# Patient Record
Sex: Female | Born: 1955 | Race: White | Hispanic: No | Marital: Married | Smoking: Never smoker
Health system: Southern US, Community
[De-identification: ages and names within clinical notes are randomized; demographics above are authoritative.]

## PROBLEM LIST (undated history)

## (undated) DIAGNOSIS — E739 Lactose intolerance, unspecified: Secondary | ICD-10-CM

## (undated) DIAGNOSIS — F1910 Other psychoactive substance abuse, uncomplicated: Secondary | ICD-10-CM

## (undated) DIAGNOSIS — D219 Benign neoplasm of connective and other soft tissue, unspecified: Secondary | ICD-10-CM

## (undated) HISTORY — PX: ADENOIDECTOMY: SUR15

## (undated) HISTORY — DX: Lactose intolerance, unspecified: E73.9

## (undated) HISTORY — DX: Benign neoplasm of connective and other soft tissue, unspecified: D21.9

## (undated) HISTORY — DX: Other psychoactive substance abuse, uncomplicated: F19.10

## (undated) HISTORY — PX: TONSILLECTOMY: SUR1361

---

## 1998-07-11 ENCOUNTER — Encounter: Admission: RE | Admit: 1998-07-11 | Discharge: 1998-07-11 | Payer: Self-pay | Admitting: Internal Medicine

## 1999-04-07 ENCOUNTER — Other Ambulatory Visit: Admission: RE | Admit: 1999-04-07 | Discharge: 1999-04-07 | Payer: Self-pay | Admitting: Obstetrics and Gynecology

## 2000-02-23 ENCOUNTER — Other Ambulatory Visit: Admission: RE | Admit: 2000-02-23 | Discharge: 2000-02-23 | Payer: Self-pay | Admitting: Obstetrics and Gynecology

## 2000-03-31 ENCOUNTER — Other Ambulatory Visit: Admission: RE | Admit: 2000-03-31 | Discharge: 2000-03-31 | Payer: Self-pay | Admitting: Radiology

## 2001-02-21 ENCOUNTER — Other Ambulatory Visit: Admission: RE | Admit: 2001-02-21 | Discharge: 2001-02-21 | Payer: Self-pay | Admitting: Obstetrics and Gynecology

## 2002-04-10 ENCOUNTER — Other Ambulatory Visit: Admission: RE | Admit: 2002-04-10 | Discharge: 2002-04-10 | Payer: Self-pay | Admitting: Obstetrics and Gynecology

## 2003-04-12 ENCOUNTER — Other Ambulatory Visit: Admission: RE | Admit: 2003-04-12 | Discharge: 2003-04-12 | Payer: Self-pay | Admitting: Obstetrics and Gynecology

## 2004-04-14 ENCOUNTER — Other Ambulatory Visit: Admission: RE | Admit: 2004-04-14 | Discharge: 2004-04-14 | Payer: Self-pay | Admitting: Obstetrics and Gynecology

## 2005-04-16 ENCOUNTER — Other Ambulatory Visit: Admission: RE | Admit: 2005-04-16 | Discharge: 2005-04-16 | Payer: Self-pay | Admitting: Obstetrics and Gynecology

## 2005-06-21 DIAGNOSIS — D219 Benign neoplasm of connective and other soft tissue, unspecified: Secondary | ICD-10-CM

## 2005-06-21 HISTORY — DX: Benign neoplasm of connective and other soft tissue, unspecified: D21.9

## 2006-04-19 ENCOUNTER — Other Ambulatory Visit: Admission: RE | Admit: 2006-04-19 | Discharge: 2006-04-19 | Payer: Self-pay | Admitting: Obstetrics and Gynecology

## 2006-05-25 HISTORY — PX: ENDOMETRIAL BIOPSY: SHX622

## 2008-04-02 ENCOUNTER — Other Ambulatory Visit: Admission: RE | Admit: 2008-04-02 | Discharge: 2008-04-02 | Payer: Self-pay | Admitting: Obstetrics and Gynecology

## 2008-06-21 DIAGNOSIS — F1910 Other psychoactive substance abuse, uncomplicated: Secondary | ICD-10-CM

## 2008-06-21 HISTORY — PX: NASAL SINUS SURGERY: SHX719

## 2008-06-21 HISTORY — DX: Other psychoactive substance abuse, uncomplicated: F19.10

## 2008-09-25 ENCOUNTER — Encounter: Admission: RE | Admit: 2008-09-25 | Discharge: 2008-09-25 | Payer: Self-pay | Admitting: Family Medicine

## 2008-10-21 ENCOUNTER — Encounter: Admission: RE | Admit: 2008-10-21 | Discharge: 2008-10-21 | Payer: Self-pay | Admitting: Family Medicine

## 2009-05-19 ENCOUNTER — Encounter: Admission: RE | Admit: 2009-05-19 | Discharge: 2009-05-19 | Payer: Self-pay | Admitting: Obstetrics and Gynecology

## 2009-06-10 ENCOUNTER — Ambulatory Visit (HOSPITAL_BASED_OUTPATIENT_CLINIC_OR_DEPARTMENT_OTHER): Admission: RE | Admit: 2009-06-10 | Discharge: 2009-06-10 | Payer: Self-pay | Admitting: Orthopedic Surgery

## 2010-04-29 IMAGING — MG MM DIGITAL SCREENING
4 series · 4 of 4 positions shown · non-contrast
Comparison: Prior studies.

DG SCREEN MAMMOGRAM BILATERAL
Bilateral CC and MLO view(s) were taken.

DIGITAL SCREENING MAMMOGRAM WITH CAD:

[R CC]
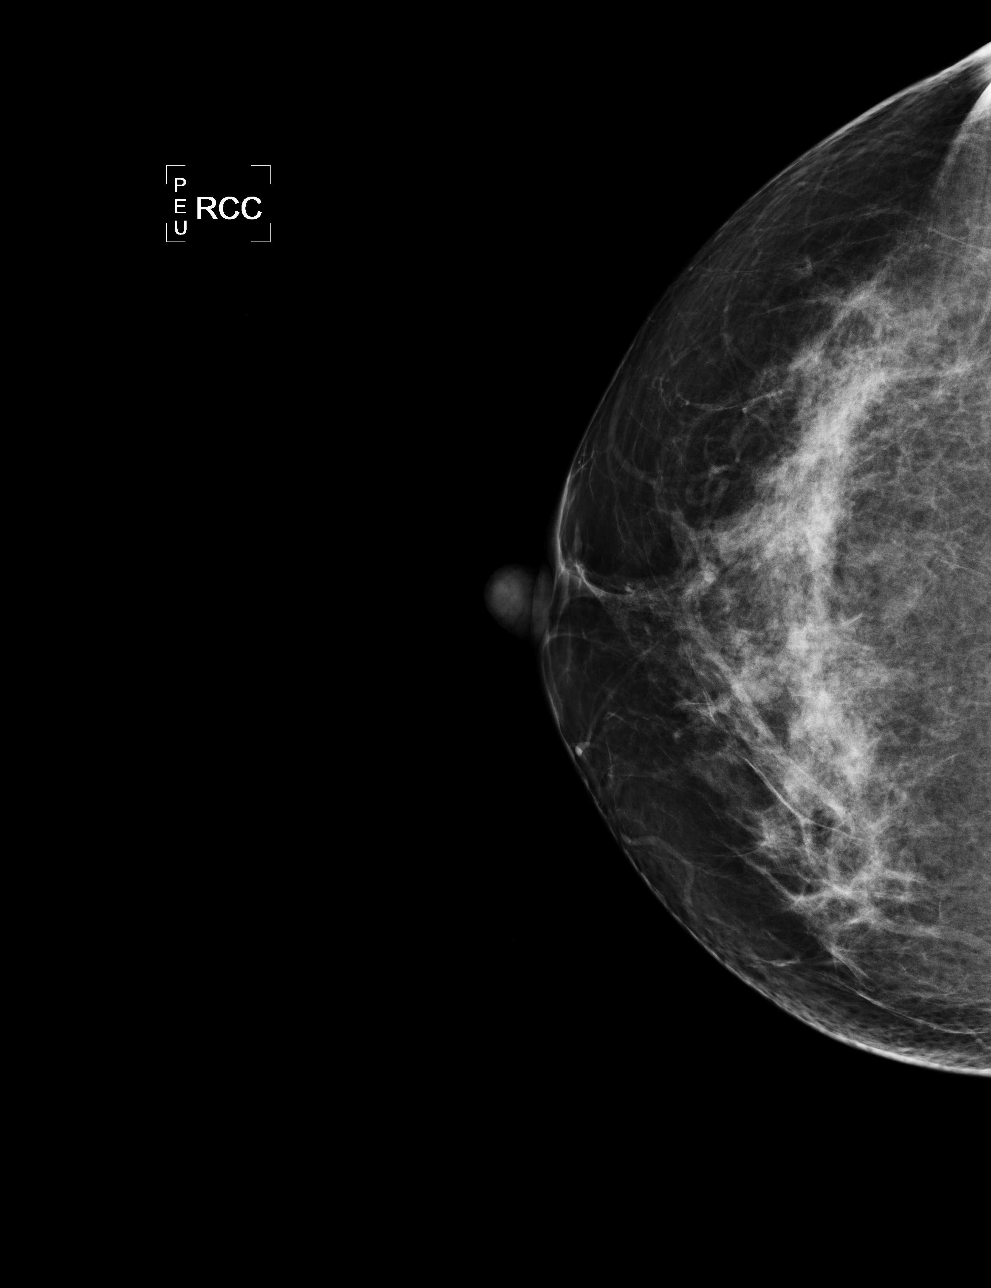

[L CC]
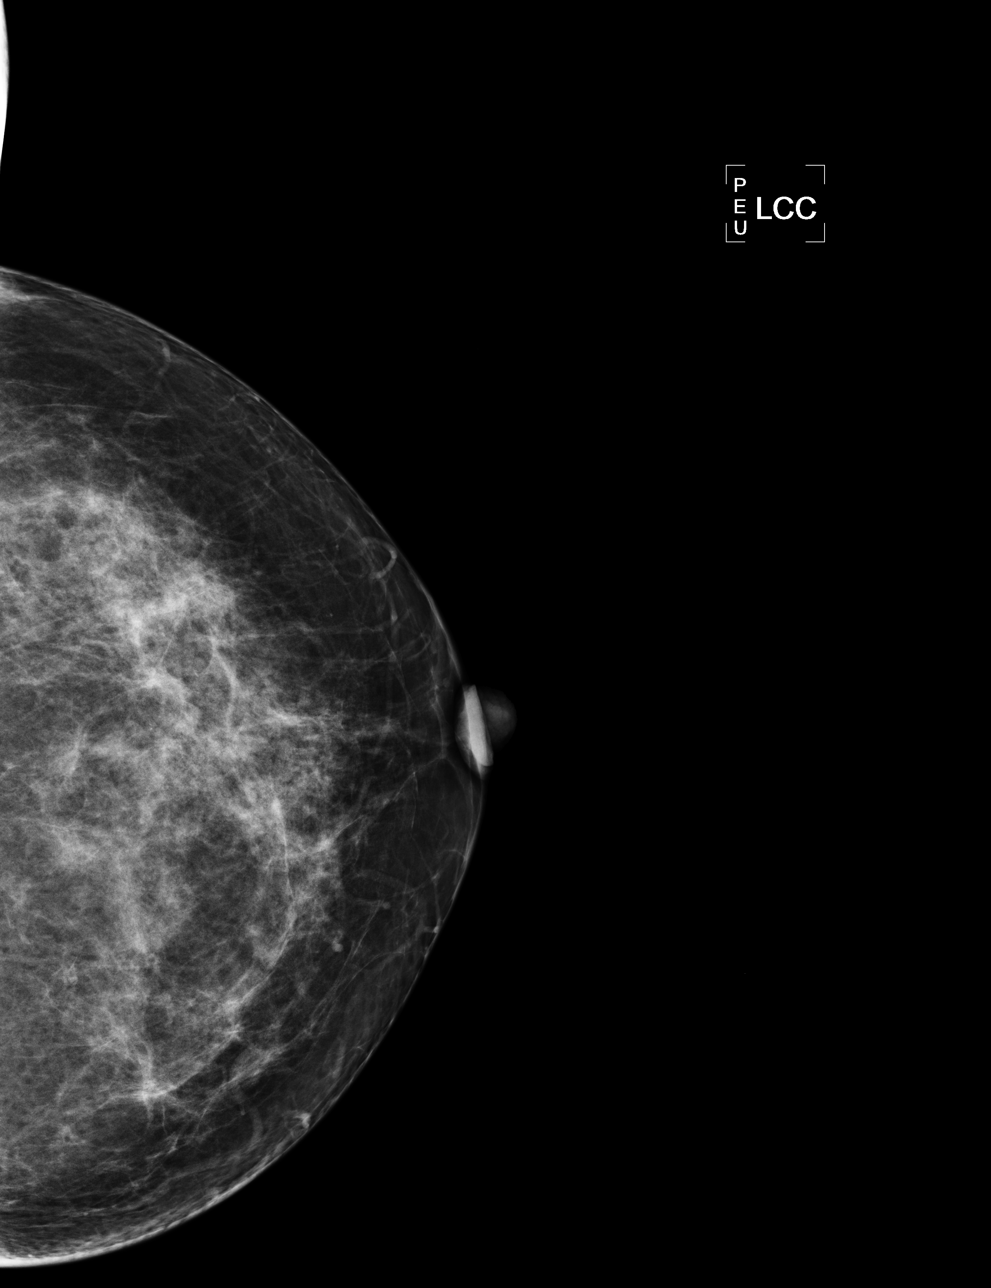

[L MLO]
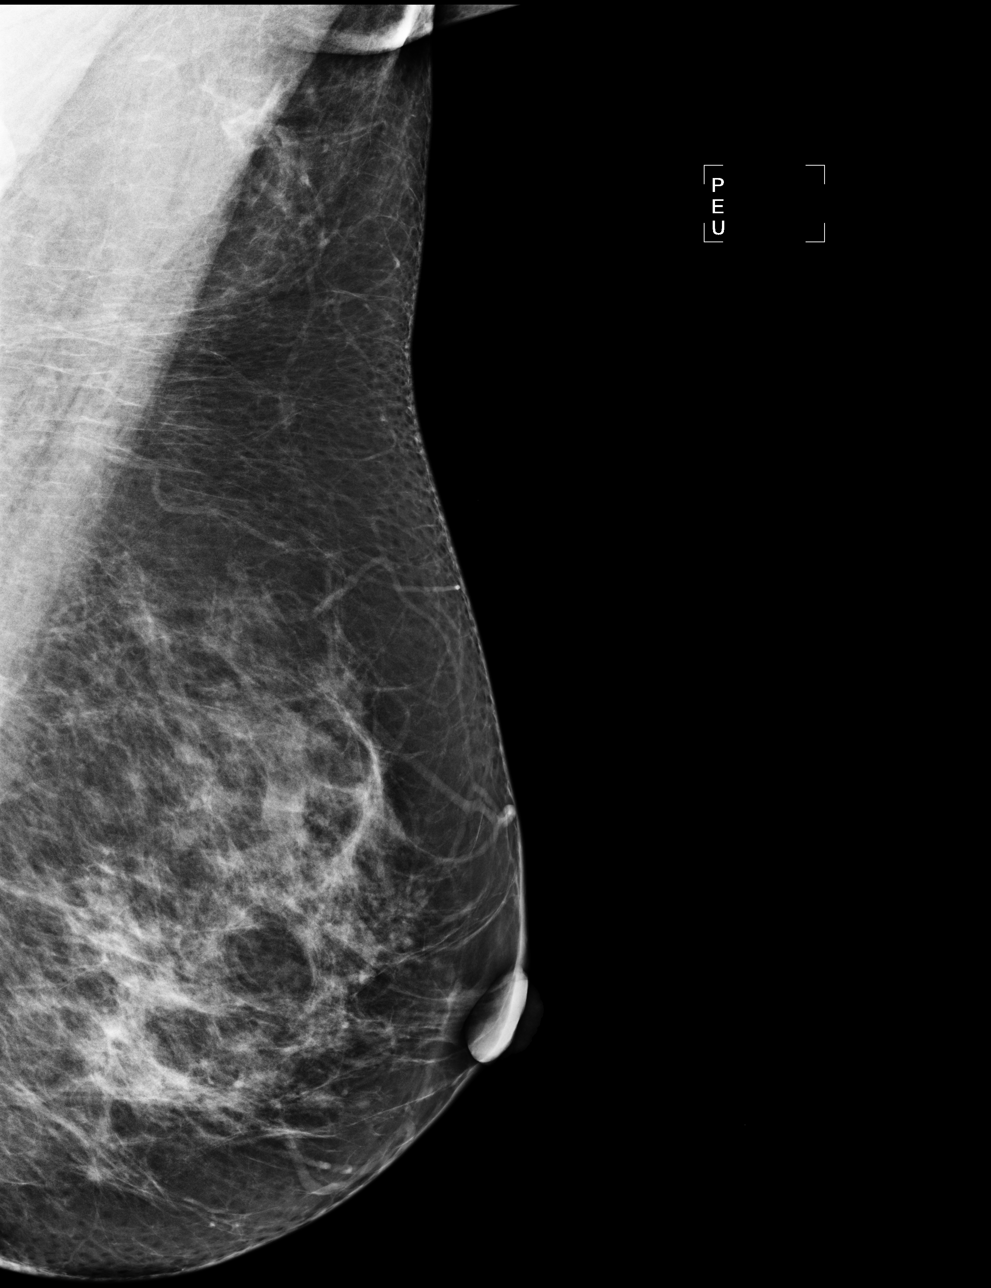

[R MLO]
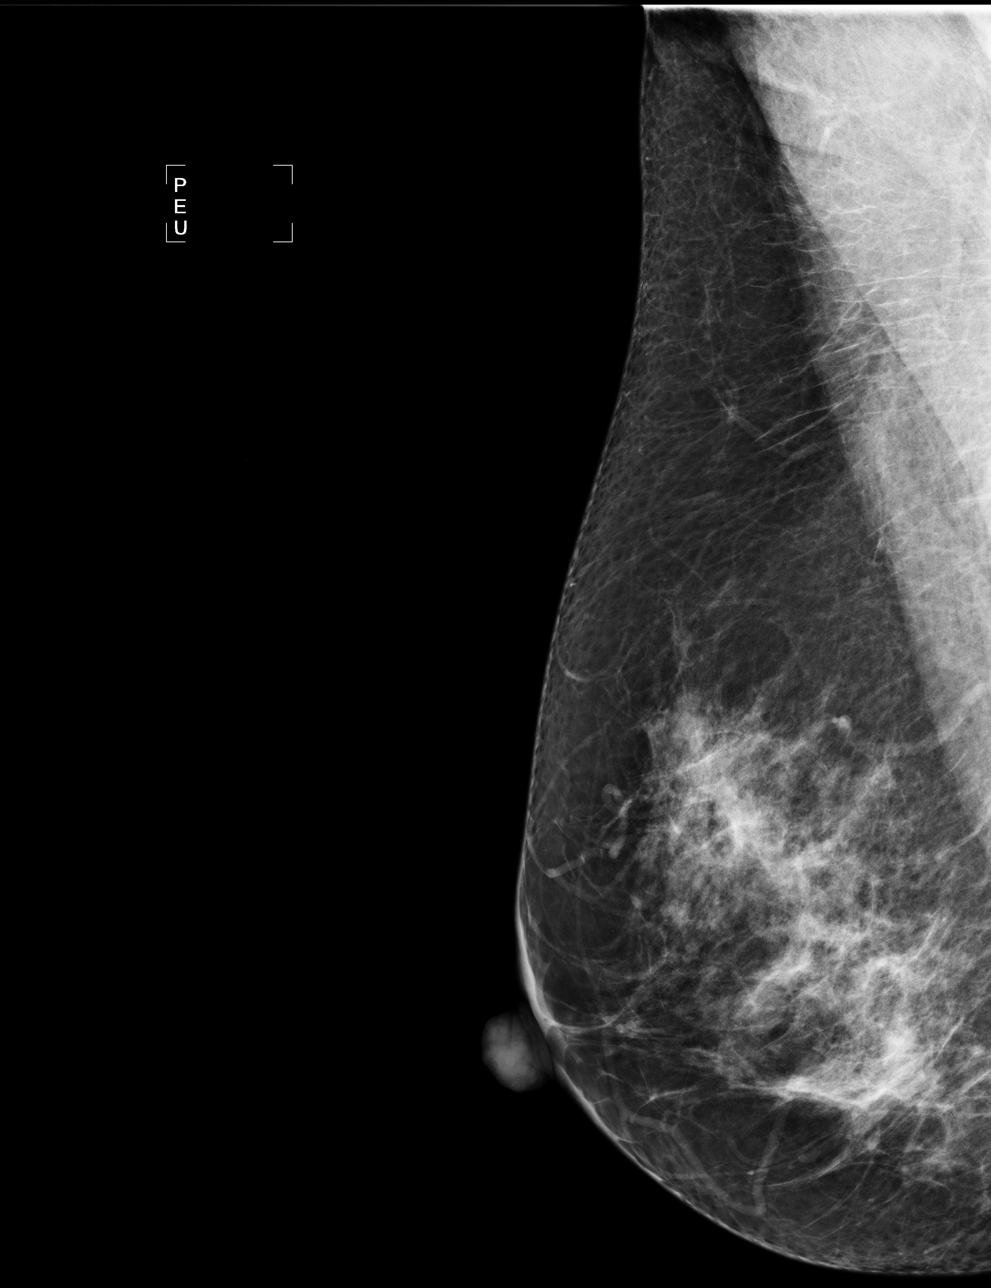

[4 of 4 positions shown; findings below may reference images not displayed]

The breast tissue is heterogeneously dense.  There is no dominant mass, architectural distortion or
calcification to suggest malignancy.

Images were processed with CAD.
IMPRESSION: No mammographic evidence of malignancy.  Suggest yearly screening mammography.

A result letter of this screening mammogram will be mailed directly to the patient.

ASSESSMENT: Negative - BI-RADS 1

Screening mammogram in 1 year.
,

## 2010-05-25 ENCOUNTER — Encounter: Admission: RE | Admit: 2010-05-25 | Discharge: 2010-05-25 | Payer: Self-pay | Admitting: Obstetrics and Gynecology

## 2010-07-12 ENCOUNTER — Encounter: Payer: Self-pay | Admitting: Obstetrics and Gynecology

## 2011-04-30 ENCOUNTER — Other Ambulatory Visit: Payer: Self-pay | Admitting: Obstetrics and Gynecology

## 2011-04-30 DIAGNOSIS — Z1231 Encounter for screening mammogram for malignant neoplasm of breast: Secondary | ICD-10-CM

## 2011-05-27 ENCOUNTER — Ambulatory Visit
Admission: RE | Admit: 2011-05-27 | Discharge: 2011-05-27 | Disposition: A | Discharge status: Home / Self Care | Payer: BC Managed Care – PPO | Source: Ambulatory Visit | Attending: Obstetrics and Gynecology | Admitting: Obstetrics and Gynecology

## 2011-05-27 DIAGNOSIS — Z1231 Encounter for screening mammogram for malignant neoplasm of breast: Secondary | ICD-10-CM

## 2012-05-05 ENCOUNTER — Other Ambulatory Visit: Payer: Self-pay | Admitting: Obstetrics and Gynecology

## 2012-05-05 DIAGNOSIS — Z1231 Encounter for screening mammogram for malignant neoplasm of breast: Secondary | ICD-10-CM

## 2012-06-13 ENCOUNTER — Ambulatory Visit
Admission: RE | Admit: 2012-06-13 | Discharge: 2012-06-13 | Disposition: A | Discharge status: Home / Self Care | Payer: BC Managed Care – PPO | Source: Ambulatory Visit | Attending: Obstetrics and Gynecology | Admitting: Obstetrics and Gynecology

## 2012-06-13 DIAGNOSIS — Z1231 Encounter for screening mammogram for malignant neoplasm of breast: Secondary | ICD-10-CM

## 2013-04-06 ENCOUNTER — Encounter: Payer: Self-pay | Admitting: Obstetrics and Gynecology

## 2013-04-20 ENCOUNTER — Other Ambulatory Visit: Payer: Self-pay | Admitting: Orthopedic Surgery

## 2013-04-23 NOTE — Progress Notes (Signed)
Reviewed preop-to bring any meds and ins card and id

## 2013-04-27 ENCOUNTER — Ambulatory Visit (HOSPITAL_BASED_OUTPATIENT_CLINIC_OR_DEPARTMENT_OTHER)
Admission: RE | Admit: 2013-04-27 | Discharge: 2013-04-27 | Disposition: A | Discharge status: Home / Self Care | Payer: BC Managed Care – PPO | Source: Ambulatory Visit | Attending: Orthopedic Surgery | Admitting: Orthopedic Surgery

## 2013-04-27 ENCOUNTER — Encounter (HOSPITAL_BASED_OUTPATIENT_CLINIC_OR_DEPARTMENT_OTHER)
Admission: RE | Disposition: A | Discharge status: Home / Self Care | Payer: Self-pay | Source: Ambulatory Visit | Attending: Orthopedic Surgery

## 2013-04-27 DIAGNOSIS — M898X9 Other specified disorders of bone, unspecified site: Secondary | ICD-10-CM | POA: Insufficient documentation

## 2013-04-27 DIAGNOSIS — M24049 Loose body in unspecified finger joint(s): Secondary | ICD-10-CM | POA: Insufficient documentation

## 2013-04-27 DIAGNOSIS — M19049 Primary osteoarthritis, unspecified hand: Secondary | ICD-10-CM | POA: Insufficient documentation

## 2013-04-27 DIAGNOSIS — M6740 Ganglion, unspecified site: Secondary | ICD-10-CM | POA: Insufficient documentation

## 2013-04-27 DIAGNOSIS — L609 Nail disorder, unspecified: Secondary | ICD-10-CM | POA: Insufficient documentation

## 2013-04-27 HISTORY — PX: IRRIGATION AND DEBRIDEMENT ABSCESS: SHX5252

## 2013-04-27 SURGERY — MINOR INCISION AND DRAINAGE OF ABSCESS
Anesthesia: LOCAL | Site: Finger | Laterality: Left | Wound class: Clean

## 2013-04-27 MED ORDER — 0.9 % SODIUM CHLORIDE (POUR BTL) OPTIME
TOPICAL | Status: DC | PRN
  Administered 2013-04-27: 200 mL

## 2013-04-27 MED ORDER — ACETAMINOPHEN-CODEINE #3 300-30 MG PO TABS: 1.0000 | ORAL_TABLET | ORAL | Status: DC | PRN

## 2013-04-27 MED ORDER — CHLORHEXIDINE GLUCONATE 4 % EX LIQD: 60.0000 mL | Freq: Once | CUTANEOUS | Status: DC

## 2013-04-27 MED ORDER — LIDOCAINE HCL 2 % IJ SOLN
INTRAMUSCULAR | Status: DC | PRN
  Administered 2013-04-27: 5.5 mL

## 2013-04-27 MED ORDER — DOXYCYCLINE HYCLATE 100 MG PO TABS: 100.0000 mg | ORAL_TABLET | Freq: Two times a day (BID) | ORAL | Status: DC

## 2013-04-27 MED ORDER — METHYLPREDNISOLONE ACETATE 40 MG/ML IJ SUSP
INTRAMUSCULAR | Status: AC
  Filled 2013-04-27: qty 1

## 2013-04-27 MED ORDER — LIDOCAINE HCL 2 % IJ SOLN
INTRAMUSCULAR | Status: AC
  Filled 2013-04-27: qty 20

## 2013-04-27 SURGICAL SUPPLY — 39 items
BANDAGE ADHESIVE 1X3 (GAUZE/BANDAGES/DRESSINGS) IMPLANT
BANDAGE COBAN STERILE 2 (GAUZE/BANDAGES/DRESSINGS) IMPLANT
BLADE SURG 15 STRL LF DISP TIS (BLADE) ×1 IMPLANT
BLADE SURG 15 STRL SS (BLADE) ×2
BNDG CMPR 9X4 STRL LF SNTH (GAUZE/BANDAGES/DRESSINGS)
BNDG CMPR MD 5X2 ELC HKLP STRL (GAUZE/BANDAGES/DRESSINGS)
BNDG COHESIVE 1X5 TAN STRL LF (GAUZE/BANDAGES/DRESSINGS) ×1 IMPLANT
BNDG ELASTIC 2 VLCR STRL LF (GAUZE/BANDAGES/DRESSINGS) IMPLANT
BNDG ESMARK 4X9 LF (GAUZE/BANDAGES/DRESSINGS) IMPLANT
BRUSH SCRUB EZ PLAIN DRY (MISCELLANEOUS) ×2 IMPLANT
CORDS BIPOLAR (ELECTRODE) IMPLANT
COVER MAYO STAND STRL (DRAPES) ×2 IMPLANT
CUFF TOURNIQUET SINGLE 18IN (TOURNIQUET CUFF) IMPLANT
DECANTER SPIKE VIAL GLASS SM (MISCELLANEOUS) IMPLANT
DRAIN PENROSE 1/2X12 LTX STRL (WOUND CARE) ×1 IMPLANT
DRAPE SURG 17X23 STRL (DRAPES) ×2 IMPLANT
GAUZE SPONGE 4X4 12PLY STRL LF (GAUZE/BANDAGES/DRESSINGS) ×2 IMPLANT
GAUZE XEROFORM 1X8 LF (GAUZE/BANDAGES/DRESSINGS) ×1 IMPLANT
GLOVE BIOGEL M STRL SZ7.5 (GLOVE) ×2 IMPLANT
GLOVE ECLIPSE 6.5 STRL STRAW (GLOVE) ×1 IMPLANT
GLOVE EXAM NITRILE MD LF STRL (GLOVE) ×1 IMPLANT
GLOVE ORTHO TXT STRL SZ7.5 (GLOVE) ×2 IMPLANT
GOWN BRE IMP PREV XXLGXLNG (GOWN DISPOSABLE) ×4 IMPLANT
GOWN PREVENTION PLUS XLARGE (GOWN DISPOSABLE) ×2 IMPLANT
NEEDLE 27GAX1X1/2 (NEEDLE) ×1 IMPLANT
PACK BASIN DAY SURGERY FS (CUSTOM PROCEDURE TRAY) ×2 IMPLANT
PADDING CAST ABS 4INX4YD NS (CAST SUPPLIES)
PADDING CAST ABS COTTON 4X4 ST (CAST SUPPLIES) ×1 IMPLANT
SPONGE GAUZE 4X4 12PLY (GAUZE/BANDAGES/DRESSINGS) ×1 IMPLANT
STOCKINETTE 4X48 STRL (DRAPES) ×2 IMPLANT
STRIP CLOSURE SKIN 1/2X4 (GAUZE/BANDAGES/DRESSINGS) IMPLANT
SUT ETHILON 5 0 P 3 18 (SUTURE) ×1
SUT NYLON ETHILON 5-0 P-3 1X18 (SUTURE) ×1 IMPLANT
SUT PROLENE 4 0 P 3 18 (SUTURE) ×1 IMPLANT
SYR 3ML 23GX1 SAFETY (SYRINGE) IMPLANT
SYR CONTROL 10ML LL (SYRINGE) ×1 IMPLANT
TOWEL OR 17X24 6PK STRL BLUE (TOWEL DISPOSABLE) ×4 IMPLANT
TRAY DSU PREP LF (CUSTOM PROCEDURE TRAY) ×2 IMPLANT
UNDERPAD 30X30 INCONTINENT (UNDERPADS AND DIAPERS) ×2 IMPLANT

## 2013-04-27 NOTE — Brief Op Note (Signed)
04/27/2013  1:42 PM  PATIENT:  Nichole Gill  57 y.o. female  PRE-OPERATIVE DIAGNOSIS:  Left long mucoid cyst, left ring mucoid cyst   POST-OPERATIVE DIAGNOSIS:  Left long mucoid cyst, left ring mucoid cyst   PROCEDURE:  Procedure(s) with comments: DEBRIDEMENT DISTAL INTERPHALANGEAL JOINT, EXCISION MUCOID CYSTS LEFT LONG FINGER AND LEFT RING FINGER (MINOR PROCEDURE)  (Left) - PENROSE DRAIN USED FOR TOURNIQUET--ON LEFT RING AT 1309, OFF AT 1323. ON LEFT LONG FINGER AT 1325, OFF AT 1335.   SURGEON:  Surgeon(s) and Role:    * Wyn Forster., MD - Primary  PHYSICIAN ASSISTANT:   ASSISTANTS: Mallory Shirk.A-C    ANESTHESIA:   local  EBL:     BLOOD ADMINISTERED:none  DRAINS: none   LOCAL MEDICATIONS USED:  XYLOCAINE   SPECIMEN:  No Specimen  DISPOSITION OF SPECIMEN:  N/A  COUNTS:  YES  TOURNIQUET:  * No tourniquets in log *  DICTATION: .Other Dictation: Dictation Number [id number]  PLAN OF CARE: Discharge to home after PACU  PATIENT DISPOSITION:  PACU - hemodynamically stable.   Delay start of Pharmacological VTE agent (>24hrs) due to surgical blood loss or risk of bleeding: not applicable

## 2013-04-27 NOTE — H&P (Signed)
    Nichole Gill returns for follow-up examination of her hands.  I last saw Nichole Gill in April of 2014 at which time we injected her left ring finger DIP joint in an effort to quell the onset of mucoid cyst formation.  Unfortunately, she has had a persistent mucoid cyst in the left ring finger at the DIP joint with a full length nail groove and now has a tense and painful mucoid cyst in the left long finger with a full length nail groove.   Nichole Gill returns requesting that we dbride her left long and ring fingers like we did a mucoid cyst of the right hand in 2010.  Nichole Gill and I updated her medical history.  She has no drug allergies.  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  Her husband, Nichole Gill, is an esteemed urology colleague currently working at [employer] in [place], [place].    Nichole Gill's past history is updated from her last visit.  She reports that she is 5'5" tall and weighs 165 pounds.  She has no drug allergies.  Current medications are multivitamins, calcium, FiberCon, 5 HTP and ibuprofen.  Surgical history, family history and social history are updated and otherwise unchanged.    Physical examination reveals a well appearing 57 year-old woman.  Inspection of her hands reveals mucoid cysts adjacent to the nail folds of the left long and ring fingers.  She has full length nail grooves.  Prior x-rays in April of 2014 demonstrate bone-on-bone arthropathy in the ring finger DIP joint and marked narrowing of the long finger DIP joint.    I have advised Nichole Gill that it is very reasonable to proceed with debridement of her DIP joints and mucoid cysts removal under local anesthesia with or without sedation.  During her 2010 surgery she underwent straight local without any significant difficulties.    We will schedule this at a mutually convenient time in the near future.    H&P documentation: 04/27/2013  -History and Physical Reviewed  -Patient has been  re-examined  -No change in the plan of care  Wyn Forster, MD

## 2013-04-27 NOTE — Op Note (Signed)
[id number] 

## 2013-04-27 NOTE — OR Nursing (Signed)
Patient unable to remove ring from ring finger of left hand.  Dr. Teressa Senter aware.  Tegaderm applied over ring prior to prep per Dr. Stark Jock order.  Patient instructed to observe finger postoperatively for excessive swelling and to keep hand elevated and to notify Dr. Teressa Senter for excessive swelling and pain not relieved with pain medication.  Patient verbalized understanding.

## 2013-04-30 ENCOUNTER — Encounter (HOSPITAL_BASED_OUTPATIENT_CLINIC_OR_DEPARTMENT_OTHER): Payer: Self-pay | Admitting: Orthopedic Surgery

## 2013-04-30 NOTE — Op Note (Signed)
NAMEAZZURE, GARABEDIAN             ACCOUNT NO.:  [id number]  MEDICAL RECORD NO.:  [id number]  LOCATION:                               FACILITY:  [facility]  PHYSICIAN:  Katy Fitch. Bensyn Bornemann, M.D. 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  PROCEDURE: 1. Resection of dorsal mucoid cyst with DIP joint debridement and     removal of loose bodies, left long finger. 2. Removal of dorsal mucoid cyst with left ring finger DIP joint     debridement.  OPERATING SURGEON:  Katy Fitch. Hannalee Castor, M.D.  ASSISTANT:  Marveen Reeks. Dasnoit, PA-C  ANESTHESIA:  A 2% lidocaine metacarpal head level block of left long and ring fingers.  This was a minor operating room procedure.  ANESTHETIST:  Katy Fitch. Tavares Levinson, M.D.  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 In 2010, we removed the mucoid cyst from her right long finger distal interphalangeal joint under local anesthesia.  She had an excellent result with recovery of a normal appearance of her nail.  Recently in October, 2014, she returned to our office with mucoid cyst involving the left long and left ring fingers.  X-rays of Ms. Buckalew fingers revealed significant degenerative arthritis of the distal interphalangeal joints of the left long and ring fingers without obvious loose bodies.  She did  have significant marginal osteophyte formation at the middle phalangeal head and the base of the distal phalanges bilaterally.  She requested that we perform similar surgery to her left long ring fingers that we did to her right long finger in the past.  After detailed informed consent, she is brought to the operating room at this time.  Preoperatively, we were challenged by the fact that her wedding band that had been in place for more than 30 years, was not able to be removed from her left ring finger.  We felt that covering this with Tegaderm and proceeding with a complete prepped with Betadine soap solution, would provide adequate stability to proceed.  Due to the intra-articular nature of this procedure, Ms. Augspurger will be covered with doxycycline postoperatively as a prophylactic antibiotic and appropriate analgesics.  After informed consent, she is brought to the operating room at this time.  DESCRIPTION OF PROCEDURE:  Scotty Purnell was brought to room #8 of the [facility] and placed supine position on the operating table.  Following informed consent and Betadine prep, 2% lidocaine was infiltrated at metacarpal head level of the long and ring fingers, with a total volume of 4.5 mL.  After 5 minutes excellent anesthesia of the left long and ring finger was  achieved.  The left hand and arm were then prepped with Betadine soap and solution, sterilely draped with sterile stockinette and towels.  The left ring finger was exsanguinated with a gauze wrap and a half-inch Penrose drain placed over the proximal phalangeal segment of the finger as a digital tourniquet.  Following routine surgical time-out, we proceeded to perform a curvilinear incision incorporating the cyst distally and exposing the dorsal ulnar aspect of the DIP joint.  A arthrotomy was created by resecting the capsule between the terminal extensor tendon slip and the ulnar collateral ligament.   A Henner elevator and a micro rongeur were used to perform synovectomy followed by use of a curette to remove marginal osteophytes at the base of the distal phalanx.  The DIP joint was debrided thoroughly and irrigated with a 19-gauge blunt dental needle and sterile saline under pressure. We then debrided the capsule of the mucoid cyst.  Straight its contents and performed a curettage of the dorsal periosteum of the distal phalanx, taking care to avoid injury to the nail matrix.  The wound was inspected for hemostasis.  Subsequently repaired with intradermal 4-0 Prolene.  The wound was dressed with Xeroflo, sterile gauze, and a Coban finger dressing.  We then released the tourniquet with immediate capillary refill to the ring finger.  We then performed an exsanguination of the left long finger with a gauze wrap followed by application of a half-inch Penrose drain over the proximal phalangeal segment as a digital tourniquet.  A curvilinear incision was fashioned in the dorsal radial aspect of the long finger incorporating the cyst distally.  The capsule was excised between the radial collateral ligament and terminal extensor tendon slip.  A small loose body was extruded with mucin from the joint.  We then performed a synovectomy and removal of marginal osteophytes and 1 more loose body from the DIP joint of the long finger.  I showed Ms. Cura some of the material that was removed from the joint.  After completion of dorsal synovectomy with a micro rongeur, the long finger DIP joint was irrigated with sterile saline using a 19-gauge blunt dental needle and sterile saline with a syringe.  We then repaired the wound with intradermal 4-0 Prolene, applied Xeroflo and a sterile gauze dressing with Coban.  For aftercare, Ms. Troiani was provided a prescription for doxycycline 100 mg p.o. b.i.d. x7 days as a prophylactic antibiotic.  She is cautioned to drink 8 ounce of water with each  dose and to take a small amount of food before taking the doxycycline to prevent GI upset.  She is also provided a prescription for Tylenol with Codeine #3, 1 or 2 p.o. q. 4-6 hours p.r.n. pain.     Katy Fitch Derrik Mceachern, M.D.     RVS/MEDQ  D:  04/27/2013  T:  04/28/2013  Job:  [id number]  cc:   [place] [employer], M.D. Gretta Arab Valentina Lucks, M.D.

## 2013-05-21 ENCOUNTER — Ambulatory Visit: Payer: Self-pay | Admitting: Obstetrics and Gynecology

## 2013-05-23 ENCOUNTER — Encounter: Payer: Self-pay | Admitting: Obstetrics and Gynecology

## 2013-05-23 ENCOUNTER — Ambulatory Visit: Payer: Self-pay | Admitting: Obstetrics and Gynecology

## 2013-06-04 ENCOUNTER — Telehealth: Payer: Self-pay | Admitting: Obstetrics and Gynecology

## 2013-06-04 NOTE — Telephone Encounter (Signed)
Please remove the $25.00 dnka fee from the patients account. Patient says she is no longer a patient at our practice.

## 2015-10-10 DIAGNOSIS — H1045 Other chronic allergic conjunctivitis: Secondary | ICD-10-CM | POA: Diagnosis not present

## 2015-11-30 DIAGNOSIS — B029 Zoster without complications: Secondary | ICD-10-CM | POA: Diagnosis not present

## 2016-05-03 DIAGNOSIS — F411 Generalized anxiety disorder: Secondary | ICD-10-CM | POA: Diagnosis not present

## 2016-05-27 DIAGNOSIS — F411 Generalized anxiety disorder: Secondary | ICD-10-CM | POA: Diagnosis not present

## 2016-06-24 DIAGNOSIS — Z1231 Encounter for screening mammogram for malignant neoplasm of breast: Secondary | ICD-10-CM | POA: Diagnosis not present

## 2016-06-24 DIAGNOSIS — Z6826 Body mass index (BMI) 26.0-26.9, adult: Secondary | ICD-10-CM | POA: Diagnosis not present

## 2016-06-24 DIAGNOSIS — Z01419 Encounter for gynecological examination (general) (routine) without abnormal findings: Secondary | ICD-10-CM | POA: Diagnosis not present

## 2016-06-25 DIAGNOSIS — F411 Generalized anxiety disorder: Secondary | ICD-10-CM | POA: Diagnosis not present

## 2016-07-13 DIAGNOSIS — F411 Generalized anxiety disorder: Secondary | ICD-10-CM | POA: Diagnosis not present

## 2016-07-23 DIAGNOSIS — F411 Generalized anxiety disorder: Secondary | ICD-10-CM | POA: Diagnosis not present

## 2016-07-30 DIAGNOSIS — F411 Generalized anxiety disorder: Secondary | ICD-10-CM | POA: Diagnosis not present

## 2016-08-20 DIAGNOSIS — F411 Generalized anxiety disorder: Secondary | ICD-10-CM | POA: Diagnosis not present

## 2016-08-29 DIAGNOSIS — H608X1 Other otitis externa, right ear: Secondary | ICD-10-CM | POA: Diagnosis not present

## 2016-08-29 DIAGNOSIS — H6121 Impacted cerumen, right ear: Secondary | ICD-10-CM | POA: Diagnosis not present

## 2016-09-27 DIAGNOSIS — R430 Anosmia: Secondary | ICD-10-CM | POA: Diagnosis not present

## 2016-09-27 DIAGNOSIS — J349 Unspecified disorder of nose and nasal sinuses: Secondary | ICD-10-CM | POA: Diagnosis not present

## 2016-10-26 DIAGNOSIS — R430 Anosmia: Secondary | ICD-10-CM | POA: Diagnosis not present

## 2016-10-26 DIAGNOSIS — J324 Chronic pansinusitis: Secondary | ICD-10-CM | POA: Diagnosis not present

## 2016-10-26 DIAGNOSIS — H6983 Other specified disorders of Eustachian tube, bilateral: Secondary | ICD-10-CM | POA: Diagnosis not present

## 2016-11-23 DIAGNOSIS — R430 Anosmia: Secondary | ICD-10-CM | POA: Diagnosis not present

## 2016-11-24 ENCOUNTER — Other Ambulatory Visit: Payer: Self-pay | Admitting: Otolaryngology

## 2016-11-24 DIAGNOSIS — R430 Anosmia: Secondary | ICD-10-CM

## 2016-12-20 DIAGNOSIS — H25043 Posterior subcapsular polar age-related cataract, bilateral: Secondary | ICD-10-CM | POA: Diagnosis not present

## 2017-01-04 ENCOUNTER — Ambulatory Visit
Admission: RE | Admit: 2017-01-04 | Discharge: 2017-01-04 | Disposition: A | Discharge status: Home / Self Care | Payer: Federal, State, Local not specified - PPO | Source: Ambulatory Visit | Attending: Otolaryngology | Admitting: Otolaryngology

## 2017-01-04 DIAGNOSIS — R430 Anosmia: Secondary | ICD-10-CM

## 2017-01-04 MED ORDER — GADOBENATE DIMEGLUMINE 529 MG/ML IV SOLN
14.0000 mL | Freq: Once | INTRAVENOUS | Status: AC | PRN
  Administered 2017-01-04: 14 mL via INTRAVENOUS

## 2017-01-05 DIAGNOSIS — D225 Melanocytic nevi of trunk: Secondary | ICD-10-CM | POA: Diagnosis not present

## 2017-01-05 DIAGNOSIS — L814 Other melanin hyperpigmentation: Secondary | ICD-10-CM | POA: Diagnosis not present

## 2017-01-05 DIAGNOSIS — D1801 Hemangioma of skin and subcutaneous tissue: Secondary | ICD-10-CM | POA: Diagnosis not present

## 2017-01-05 DIAGNOSIS — L821 Other seborrheic keratosis: Secondary | ICD-10-CM | POA: Diagnosis not present

## 2017-02-08 DIAGNOSIS — Z13220 Encounter for screening for lipoid disorders: Secondary | ICD-10-CM | POA: Diagnosis not present

## 2017-02-08 DIAGNOSIS — Z0000 Encounter for general adult medical examination without abnormal findings: Secondary | ICD-10-CM | POA: Diagnosis not present

## 2017-02-08 DIAGNOSIS — Z131 Encounter for screening for diabetes mellitus: Secondary | ICD-10-CM | POA: Diagnosis not present

## 2017-02-08 DIAGNOSIS — Z23 Encounter for immunization: Secondary | ICD-10-CM | POA: Diagnosis not present

## 2017-07-14 DIAGNOSIS — Z13820 Encounter for screening for osteoporosis: Secondary | ICD-10-CM | POA: Diagnosis not present

## 2017-07-14 DIAGNOSIS — Z6826 Body mass index (BMI) 26.0-26.9, adult: Secondary | ICD-10-CM | POA: Diagnosis not present

## 2017-07-14 DIAGNOSIS — Z01419 Encounter for gynecological examination (general) (routine) without abnormal findings: Secondary | ICD-10-CM | POA: Diagnosis not present

## 2017-07-14 DIAGNOSIS — Z1231 Encounter for screening mammogram for malignant neoplasm of breast: Secondary | ICD-10-CM | POA: Diagnosis not present

## 2017-10-03 DIAGNOSIS — Z23 Encounter for immunization: Secondary | ICD-10-CM | POA: Diagnosis not present

## 2017-11-02 DIAGNOSIS — M25559 Pain in unspecified hip: Secondary | ICD-10-CM | POA: Diagnosis not present

## 2017-11-06 DIAGNOSIS — J019 Acute sinusitis, unspecified: Secondary | ICD-10-CM | POA: Diagnosis not present

## 2017-11-16 DIAGNOSIS — M25559 Pain in unspecified hip: Secondary | ICD-10-CM | POA: Diagnosis not present

## 2017-12-27 DIAGNOSIS — H04123 Dry eye syndrome of bilateral lacrimal glands: Secondary | ICD-10-CM | POA: Diagnosis not present

## 2018-01-05 DIAGNOSIS — D235 Other benign neoplasm of skin of trunk: Secondary | ICD-10-CM | POA: Diagnosis not present

## 2018-01-05 DIAGNOSIS — L814 Other melanin hyperpigmentation: Secondary | ICD-10-CM | POA: Diagnosis not present

## 2018-01-05 DIAGNOSIS — D1801 Hemangioma of skin and subcutaneous tissue: Secondary | ICD-10-CM | POA: Diagnosis not present

## 2018-01-05 DIAGNOSIS — D225 Melanocytic nevi of trunk: Secondary | ICD-10-CM | POA: Diagnosis not present

## 2018-01-05 DIAGNOSIS — L918 Other hypertrophic disorders of the skin: Secondary | ICD-10-CM | POA: Diagnosis not present

## 2018-02-09 DIAGNOSIS — Z0000 Encounter for general adult medical examination without abnormal findings: Secondary | ICD-10-CM | POA: Diagnosis not present

## 2018-02-09 DIAGNOSIS — Z136 Encounter for screening for cardiovascular disorders: Secondary | ICD-10-CM | POA: Diagnosis not present

## 2018-05-28 DIAGNOSIS — R030 Elevated blood-pressure reading, without diagnosis of hypertension: Secondary | ICD-10-CM | POA: Diagnosis not present

## 2018-05-28 DIAGNOSIS — H9202 Otalgia, left ear: Secondary | ICD-10-CM | POA: Diagnosis not present

## 2018-07-25 DIAGNOSIS — Z6827 Body mass index (BMI) 27.0-27.9, adult: Secondary | ICD-10-CM | POA: Diagnosis not present

## 2018-07-25 DIAGNOSIS — Z01419 Encounter for gynecological examination (general) (routine) without abnormal findings: Secondary | ICD-10-CM | POA: Diagnosis not present

## 2018-07-25 DIAGNOSIS — Z1231 Encounter for screening mammogram for malignant neoplasm of breast: Secondary | ICD-10-CM | POA: Diagnosis not present

## 2018-08-16 DIAGNOSIS — L049 Acute lymphadenitis, unspecified: Secondary | ICD-10-CM | POA: Diagnosis not present

## 2018-08-16 DIAGNOSIS — M542 Cervicalgia: Secondary | ICD-10-CM | POA: Diagnosis not present

## 2018-12-25 ENCOUNTER — Other Ambulatory Visit: Payer: Self-pay

## 2018-12-25 ENCOUNTER — Encounter: Payer: Self-pay | Admitting: Allergy

## 2018-12-25 ENCOUNTER — Ambulatory Visit (INDEPENDENT_AMBULATORY_CARE_PROVIDER_SITE_OTHER): Payer: Federal, State, Local not specified - PPO | Admitting: Allergy

## 2018-12-25 VITALS — BP 120/80 | HR 64 | Temp 98.3°F | Resp 16 | Ht 65.0 in | Wt 167.0 lb

## 2018-12-25 DIAGNOSIS — J3489 Other specified disorders of nose and nasal sinuses: Secondary | ICD-10-CM

## 2018-12-25 DIAGNOSIS — R0981 Nasal congestion: Secondary | ICD-10-CM

## 2018-12-25 NOTE — Assessment & Plan Note (Addendum)
Increased nasal congestion with some rhinorrhea on the days she goes swimming. Patient has been an avid swimmer her whole life and swims 5 days per week for 1 hour at a time. No symptoms during swimming. Prior history of sinus surgery over 10 years ago. Takes daily Claritin. Concerned for chlorine allergy.  Discussed with patient that there is no testing for chlorine. She may have developed some sensitivity to it and recommend using a saline rinse/nettipot after swimming to clear the chlorine from her sinus passages.  There is some turbinate hypertrophy on exam and recommended on starting a steroid nasal spray but patient declines at this time.   Also offered environmental allergy skin testing due to her symptoms as there is some concern for indoor/outdoor allergens may be contributing to her symptoms but she declines at this time.  Continue with Claritin daily.  If above regimen does not help then return for skin testing.

## 2018-12-25 NOTE — Progress Notes (Signed)
New Patient Note  RE: Nichole Gill MRN: [id number] DOB: 09/22/1955 Date of Office Visit: 12/25/2018  Referring provider: Kelton Pillar, MD Primary care provider: Kelton Pillar, MD  Chief Complaint: Seasonal allergies and sensitive to chlorine  History of Present Illness: I had the pleasure of seeing Nichole Gill for initial evaluation at the [facility] on 12/25/2018. She is a 63 y.o. female, who is self-referred here for the evaluation of chlorine sensitivity.   Patient has been a swimmer her whole life and swims 5 times a week per 1 hour.  She was not swimming in March and April due to [place] but when she started back again noticed nasal congestion especially at night. She still has some nasal congestion on the days she is not swimming. No increased symptoms while swimming. Denies any itching, respiratory issues.  Used to take Flonase on a regular basis prior to her sinus surgery over 10 years ago. Recently tried a nasal spray which caused increased nasal congestion so she does not want to try another one at this time.   Uses nettipot as needed during URIs.   Environmental allergies:  She reports symptoms of nasal congestion, rhinorrhea, sneezing Symptoms have been going on for 10+ years. The symptoms are present during the spring. Anosmia:yes which started a few years ago after a viral infection. Headache: no. She has used Claritin with some improvement in symptoms. Sinus infections: no. Previous work up includes: no. Previous ENT evaluation: had sinus surgery 10 years ago. History of nasal polyps: no.  Assessment and Plan: Nichole Gill is a 64 y.o. female with: Nasal congestion with rhinorrhea Increased nasal congestion with some rhinorrhea on the days she goes swimming. Patient has been an avid swimmer her whole life and swims 5 days per week for 1 hour at a time. No symptoms during swimming. Prior history of sinus surgery over 10 years ago. Takes daily  Claritin. Concerned for chlorine allergy.  Discussed with patient that there is no testing for chlorine. She may have developed some sensitivity to it and recommend using a saline rinse/nettipot after swimming to clear the chlorine from her sinus passages.  There is some turbinate hypertrophy on exam and recommended on starting a steroid nasal spray but patient declines at this time.   Also offered environmental allergy skin testing due to her symptoms as there is some concern for indoor/outdoor allergens may be contributing to her symptoms but she declines at this time.  Continue with Claritin daily.  If above regimen does not help then return for skin testing.   Return if symptoms worsen or fail to improve.  Other allergy screening: Asthma: no Rhino conjunctivitis: yes Food allergy: no Medication allergy: no Hymenoptera allergy: no Urticaria: no Eczema:no History of recurrent infections suggestive of immunodeficency: no  Diagnostics: None.   Past Medical History: Patient Active Problem List   Diagnosis Date Noted  . Nasal congestion with rhinorrhea 12/25/2018   Past Medical History:  Diagnosis Date  . Fibroid 2007   3 cm  . Lactose intolerance   . Substance abuse ([place]) 2010   alcoholism   Past Surgical History: Past Surgical History:  Procedure Laterality Date  . ADENOIDECTOMY    . ENDOMETRIAL BIOPSY  05-25-06   -benign (Dr. Joan Flores)  . IRRIGATION AND DEBRIDEMENT ABSCESS Left 04/27/2013   Procedure: Minor DEBRIDEMENT DISTAL INTERPHALANGEAL JOINT, EXCISION MUCOID CYSTS LEFT LONG FINGER AND LEFT RING FINGER (MINOR PROCEDURE) ;  Surgeon: Cammie Sickle., MD;  Location: [place];  Service: Orthopedics;  Laterality: Left;  PENROSE DRAIN USED FOR TOURNIQUET--ON LEFT RING AT 1309, OFF AT 1323. ON LEFT LONG FINGER AT 1325, OFF AT 1335.   Marland Kitchen NASAL SINUS SURGERY  2010  . TONSILLECTOMY     Medication List:  Current Outpatient Medications  Medication Sig  Dispense Refill  . Loratadine (ALAVERT PO) Alavert    . Melatonin 10 MG TABS melatonin    . Multiple Vitamin (MULTI-VITAMIN DAILY PO) Multi Vitamin    . acetaminophen-codeine (TYLENOL #3) 300-30 MG per tablet Take 1 tablet by mouth every 4 (four) hours as needed for moderate pain. (Patient not taking: Reported on 12/25/2018) 20 tablet 0  . doxycycline (VIBRA-TABS) 100 MG tablet Take 1 tablet (100 mg total) by mouth 2 (two) times daily. (Patient not taking: Reported on 12/25/2018) 10 tablet 0  . ibuprofen (ADVIL,MOTRIN) 200 MG tablet Take 200 mg by mouth every 6 (six) hours as needed.    Marland Kitchen levonorgestrel (MIRENA) 20 MCG/24HR IUD 1 each by Intrauterine route once.    . loratadine (CLARITIN) 10 MG tablet Take 10 mg by mouth daily.    . polycarbophil (FIBERCON) 625 MG tablet Take 625 mg by mouth daily.     No current facility-administered medications for this visit.    Allergies: No Known Allergies Social History: Social History   Socioeconomic History  . Marital status: Married    Spouse name: Not on file  . Number of children: Not on file  . Years of education: Not on file  . Highest education level: Not on file  Occupational History  . Not on file  Social Needs  . Financial resource strain: Not on file  . Food insecurity    Worry: Not on file    Inability: Not on file  . Transportation needs    Medical: Not on file    Non-medical: Not on file  Tobacco Use  . Smoking status: Never Smoker  . Smokeless tobacco: Never Used  Substance and Sexual Activity  . Alcohol use: No  . Drug use: No  . Sexual activity: Yes    Partners: Male    Birth control/protection: I.U.D.    Comment: Mirena inserted 03/2011  Lifestyle  . Physical activity    Days per week: Not on file    Minutes per session: Not on file  . Stress: Not on file  Relationships  . Social Herbalist on phone: Not on file    Gets together: Not on file    Attends religious service: Not on file    Active member  of club or organization: Not on file    Attends meetings of clubs or organizations: Not on file    Relationship status: Not on file  Other Topics Concern  . Not on file  Social History Narrative  . Not on file   Lives in a 64 year old home. Smoking: denies Occupation: Statistician HistoryFreight forwarder in the house: no Carpet in the family room: no Carpet in the bedroom: yes Heating: gas Cooling: central Pet: no  Family History: Family History  Problem Relation Age of Onset  . Osteoporosis Mother   . Allergic rhinitis Daughter   . Asthma Daughter   . Angioedema Neg Hx   . Atopy Neg Hx   . Urticaria Neg Hx   . Eczema Neg Hx    Review of Systems  Constitutional: Negative for appetite change, chills, fever  and unexpected weight change.  HENT: Positive for congestion and rhinorrhea.   Eyes: Negative for itching.  Respiratory: Negative for cough, chest tightness, shortness of breath and wheezing.   Cardiovascular: Negative for chest pain.  Gastrointestinal: Negative for abdominal pain.  Genitourinary: Negative for difficulty urinating.  Skin: Negative for rash.  Allergic/Immunologic: Negative for environmental allergies and food allergies.  Neurological: Negative for headaches.   Objective: BP 120/80 (BP Location: Left Arm, Patient Position: Sitting, Cuff Size: Normal)   Pulse 64   Temp 98.3 F (36.8 C) (Temporal)   Resp 16   Ht 5\' 5"  (1.651 m)   Wt 167 lb (75.8 kg)   SpO2 98%   BMI 27.79 kg/m  Body mass index is 27.79 kg/m. Physical Exam  Constitutional: She is oriented to person, place, and time. She appears well-developed and well-nourished.  HENT:  Head: Normocephalic and atraumatic.  Right Ear: External ear normal.  Left Ear: External ear normal.  Nose: Mucosal edema and rhinorrhea present.  Mouth/Throat: Oropharynx is clear and moist.  Eyes: Conjunctivae and EOM are normal.  Neck: Neck supple.  Cardiovascular:  Normal rate, regular rhythm and normal heart sounds. Exam reveals no gallop and no friction rub.  No murmur heard. Pulmonary/Chest: Effort normal and breath sounds normal. She has no wheezes. She has no rales.  Abdominal: Soft.  Neurological: She is alert and oriented to person, place, and time.  Skin: Skin is warm. No rash noted.  Psychiatric: She has a normal mood and affect. Her behavior is normal.  Nursing note and vitals reviewed.  The plan was reviewed with the patient/family, and all questions/concerned were addressed.  It was my pleasure to see Blakeleigh today and participate in her care. Please feel free to contact me with any questions or concerns.  Sincerely,  Rexene Alberts, DO Allergy & Immunology  [facility] office: [phone] [facility] office: [phone]

## 2018-12-25 NOTE — Patient Instructions (Addendum)
Use saline nettipot after swimming and see if it helps.  See attached instructions on how to use properly.    May use over the counter antihistamines such as Zyrtec (cetirizine), Claritin (loratadine), Allegra (fexofenadine), or Xyzal (levocetirizine) daily as needed.  May use a steroid based nasal sprays such as Nasonex, Nasacort, Rhinocort or Flonase 1 sprays in each nostril 1-2 times a day for nasal congestion.  If above regimen does not help, then return for environmental allergy skin testing - must be off allergy medications for at least 3 days.   Follow up as needed.

## 2019-01-25 ENCOUNTER — Other Ambulatory Visit: Payer: Self-pay

## 2019-01-25 DIAGNOSIS — Z20822 Contact with and (suspected) exposure to covid-19: Secondary | ICD-10-CM

## 2019-01-26 LAB — NOVEL CORONAVIRUS, NAA: SARS-CoV-2, NAA: NOT DETECTED

## 2019-02-05 DIAGNOSIS — M19041 Primary osteoarthritis, right hand: Secondary | ICD-10-CM | POA: Diagnosis not present

## 2019-02-05 DIAGNOSIS — R2231 Localized swelling, mass and lump, right upper limb: Secondary | ICD-10-CM | POA: Diagnosis not present

## 2019-02-05 DIAGNOSIS — M6740 Ganglion, unspecified site: Secondary | ICD-10-CM | POA: Diagnosis not present

## 2019-03-15 ENCOUNTER — Other Ambulatory Visit: Payer: Self-pay | Admitting: Orthopedic Surgery

## 2019-05-04 ENCOUNTER — Other Ambulatory Visit: Payer: Self-pay

## 2019-05-04 DIAGNOSIS — Z20822 Contact with and (suspected) exposure to covid-19: Secondary | ICD-10-CM

## 2019-05-07 LAB — NOVEL CORONAVIRUS, NAA: SARS-CoV-2, NAA: NOT DETECTED

## 2019-05-08 DIAGNOSIS — Z13220 Encounter for screening for lipoid disorders: Secondary | ICD-10-CM | POA: Diagnosis not present

## 2019-05-08 DIAGNOSIS — Z0000 Encounter for general adult medical examination without abnormal findings: Secondary | ICD-10-CM | POA: Diagnosis not present

## 2019-05-08 DIAGNOSIS — Z23 Encounter for immunization: Secondary | ICD-10-CM | POA: Diagnosis not present

## 2019-05-08 DIAGNOSIS — Z8249 Family history of ischemic heart disease and other diseases of the circulatory system: Secondary | ICD-10-CM | POA: Diagnosis not present

## 2019-06-04 ENCOUNTER — Encounter (HOSPITAL_BASED_OUTPATIENT_CLINIC_OR_DEPARTMENT_OTHER): Payer: Self-pay | Admitting: Orthopedic Surgery

## 2019-06-04 ENCOUNTER — Other Ambulatory Visit: Payer: Self-pay

## 2019-06-05 ENCOUNTER — Encounter (HOSPITAL_COMMUNITY)
Admission: RE | Admit: 2019-06-05 | Discharge: 2019-06-05 | Disposition: A | Discharge status: Home / Self Care | Payer: Federal, State, Local not specified - PPO | Source: Ambulatory Visit | Attending: Orthopedic Surgery | Admitting: Orthopedic Surgery

## 2019-06-05 DIAGNOSIS — Z01812 Encounter for preprocedural laboratory examination: Secondary | ICD-10-CM | POA: Insufficient documentation

## 2019-06-05 DIAGNOSIS — Z20828 Contact with and (suspected) exposure to other viral communicable diseases: Secondary | ICD-10-CM | POA: Diagnosis not present

## 2019-06-05 NOTE — Progress Notes (Signed)

## 2019-06-06 LAB — NOVEL CORONAVIRUS, NAA (HOSP ORDER, SEND-OUT TO REF LAB; TAT 18-24 HRS): SARS-CoV-2, NAA: NOT DETECTED

## 2019-06-08 ENCOUNTER — Ambulatory Visit (HOSPITAL_BASED_OUTPATIENT_CLINIC_OR_DEPARTMENT_OTHER): Payer: Federal, State, Local not specified - PPO | Admitting: Anesthesiology

## 2019-06-08 ENCOUNTER — Ambulatory Visit (HOSPITAL_BASED_OUTPATIENT_CLINIC_OR_DEPARTMENT_OTHER)
Admission: RE | Admit: 2019-06-08 | Discharge: 2019-06-08 | Disposition: A | Discharge status: Home / Self Care | Payer: Federal, State, Local not specified - PPO | Attending: Orthopedic Surgery | Admitting: Orthopedic Surgery

## 2019-06-08 ENCOUNTER — Encounter (HOSPITAL_BASED_OUTPATIENT_CLINIC_OR_DEPARTMENT_OTHER)
Admission: RE | Disposition: A | Discharge status: Home / Self Care | Payer: Self-pay | Source: Home / Self Care | Attending: Orthopedic Surgery

## 2019-06-08 ENCOUNTER — Encounter (HOSPITAL_BASED_OUTPATIENT_CLINIC_OR_DEPARTMENT_OTHER): Payer: Self-pay | Admitting: Orthopedic Surgery

## 2019-06-08 ENCOUNTER — Other Ambulatory Visit: Payer: Self-pay

## 2019-06-08 DIAGNOSIS — L720 Epidermal cyst: Secondary | ICD-10-CM | POA: Diagnosis not present

## 2019-06-08 DIAGNOSIS — M19041 Primary osteoarthritis, right hand: Secondary | ICD-10-CM | POA: Diagnosis not present

## 2019-06-08 DIAGNOSIS — R0981 Nasal congestion: Secondary | ICD-10-CM | POA: Diagnosis not present

## 2019-06-08 DIAGNOSIS — R2231 Localized swelling, mass and lump, right upper limb: Secondary | ICD-10-CM | POA: Insufficient documentation

## 2019-06-08 DIAGNOSIS — M25741 Osteophyte, right hand: Secondary | ICD-10-CM | POA: Diagnosis not present

## 2019-06-08 DIAGNOSIS — M151 Heberden's nodes (with arthropathy): Secondary | ICD-10-CM | POA: Diagnosis not present

## 2019-06-08 DIAGNOSIS — M71341 Other bursal cyst, right hand: Secondary | ICD-10-CM | POA: Diagnosis not present

## 2019-06-08 HISTORY — PX: CYST EXCISION: SHX5701

## 2019-06-08 SURGERY — CYST REMOVAL
Anesthesia: Monitor Anesthesia Care | Site: Hand | Laterality: Right

## 2019-06-08 MED ORDER — PROMETHAZINE HCL 25 MG/ML IJ SOLN: 6.2500 mg | INTRAMUSCULAR | Status: DC | PRN

## 2019-06-08 MED ORDER — CELECOXIB 400 MG PO CAPS
400.0000 mg | ORAL_CAPSULE | Freq: Once | ORAL | Status: AC
  Administered 2019-06-08: 400 mg via ORAL

## 2019-06-08 MED ORDER — ACETAMINOPHEN 500 MG PO TABS
ORAL_TABLET | ORAL | Status: AC
  Filled 2019-06-08: qty 2

## 2019-06-08 MED ORDER — ACETAMINOPHEN 500 MG PO TABS
1000.0000 mg | ORAL_TABLET | Freq: Once | ORAL | Status: AC
  Administered 2019-06-08: 12:00:00 1000 mg via ORAL

## 2019-06-08 MED ORDER — PROPOFOL 10 MG/ML IV BOLUS
INTRAVENOUS | Status: AC
  Filled 2019-06-08: qty 20

## 2019-06-08 MED ORDER — CELECOXIB 200 MG PO CAPS
ORAL_CAPSULE | ORAL | Status: AC
  Filled 2019-06-08: qty 2

## 2019-06-08 MED ORDER — FENTANYL CITRATE (PF) 100 MCG/2ML IJ SOLN
INTRAMUSCULAR | Status: AC
  Filled 2019-06-08: qty 2

## 2019-06-08 MED ORDER — MIDAZOLAM HCL 2 MG/2ML IJ SOLN
1.0000 mg | INTRAMUSCULAR | Status: DC | PRN
  Administered 2019-06-08: 2 mg via INTRAVENOUS

## 2019-06-08 MED ORDER — CEFAZOLIN SODIUM-DEXTROSE 2-4 GM/100ML-% IV SOLN
INTRAVENOUS | Status: AC
  Filled 2019-06-08: qty 100

## 2019-06-08 MED ORDER — FENTANYL CITRATE (PF) 100 MCG/2ML IJ SOLN
50.0000 ug | INTRAMUSCULAR | Status: DC | PRN
  Administered 2019-06-08: 100 ug via INTRAVENOUS

## 2019-06-08 MED ORDER — PROPOFOL 500 MG/50ML IV EMUL
INTRAVENOUS | Status: DC | PRN
  Administered 2019-06-08: 50 mg via INTRAVENOUS
  Administered 2019-06-08: 100 ug/kg/min via INTRAVENOUS
  Administered 2019-06-08: 50 mg via INTRAVENOUS

## 2019-06-08 MED ORDER — LIDOCAINE HCL (PF) 1 % IJ SOLN
INTRAMUSCULAR | Status: DC | PRN
  Administered 2019-06-08: 4 mL

## 2019-06-08 MED ORDER — ONDANSETRON HCL 4 MG/2ML IJ SOLN
INTRAMUSCULAR | Status: AC
  Filled 2019-06-08: qty 2

## 2019-06-08 MED ORDER — LACTATED RINGERS IV SOLN: INTRAVENOUS | Status: DC

## 2019-06-08 MED ORDER — TRAMADOL HCL 50 MG PO TABS: 50.0000 mg | ORAL_TABLET | Freq: Four times a day (QID) | ORAL | 0 refills | Status: DC | PRN

## 2019-06-08 MED ORDER — BUPIVACAINE HCL (PF) 0.25 % IJ SOLN
INTRAMUSCULAR | Status: AC
  Filled 2019-06-08: qty 30

## 2019-06-08 MED ORDER — CHLORHEXIDINE GLUCONATE 4 % EX LIQD: 60.0000 mL | Freq: Once | CUTANEOUS | Status: DC

## 2019-06-08 MED ORDER — LIDOCAINE HCL (PF) 1 % IJ SOLN
INTRAMUSCULAR | Status: AC
  Filled 2019-06-08: qty 30

## 2019-06-08 MED ORDER — BUPIVACAINE HCL (PF) 0.25 % IJ SOLN
INTRAMUSCULAR | Status: DC | PRN
  Administered 2019-06-08: 4 mL

## 2019-06-08 MED ORDER — CEFAZOLIN SODIUM-DEXTROSE 2-4 GM/100ML-% IV SOLN
2.0000 g | INTRAVENOUS | Status: AC
  Administered 2019-06-08: 2 g via INTRAVENOUS

## 2019-06-08 MED ORDER — FENTANYL CITRATE (PF) 100 MCG/2ML IJ SOLN: 25.0000 ug | INTRAMUSCULAR | Status: DC | PRN

## 2019-06-08 MED ORDER — MIDAZOLAM HCL 2 MG/2ML IJ SOLN
INTRAMUSCULAR | Status: AC
  Filled 2019-06-08: qty 2

## 2019-06-08 SURGICAL SUPPLY — 47 items
APL PRP STRL LF DISP 70% ISPRP (MISCELLANEOUS) ×1
BLADE MINI RND TIP GREEN BEAV (BLADE) IMPLANT
BLADE SURG 15 STRL LF DISP TIS (BLADE) ×1 IMPLANT
BLADE SURG 15 STRL SS (BLADE) ×2
BNDG CMPR 9X4 STRL LF SNTH (GAUZE/BANDAGES/DRESSINGS)
BNDG COHESIVE 1X5 TAN STRL LF (GAUZE/BANDAGES/DRESSINGS) ×1 IMPLANT
BNDG COHESIVE 2X5 TAN STRL LF (GAUZE/BANDAGES/DRESSINGS) IMPLANT
BNDG COHESIVE 3X5 TAN STRL LF (GAUZE/BANDAGES/DRESSINGS) IMPLANT
BNDG ESMARK 4X9 LF (GAUZE/BANDAGES/DRESSINGS) IMPLANT
BNDG GAUZE ELAST 4 BULKY (GAUZE/BANDAGES/DRESSINGS) IMPLANT
CHLORAPREP W/TINT 26 (MISCELLANEOUS) ×2 IMPLANT
CORD BIPOLAR FORCEPS 12FT (ELECTRODE) ×2 IMPLANT
COVER BACK TABLE REUSABLE LG (DRAPES) ×2 IMPLANT
COVER MAYO STAND REUSABLE (DRAPES) ×2 IMPLANT
COVER WAND RF STERILE (DRAPES) IMPLANT
CUFF TOURN SGL QUICK 18X4 (TOURNIQUET CUFF) IMPLANT
DECANTER SPIKE VIAL GLASS SM (MISCELLANEOUS) IMPLANT
DRAIN PENROSE 1/2X12 LTX STRL (WOUND CARE) ×1 IMPLANT
DRAPE EXTREMITY T 121X128X90 (DISPOSABLE) ×2 IMPLANT
DRAPE SURG 17X23 STRL (DRAPES) ×2 IMPLANT
GAUZE SPONGE 4X4 12PLY STRL (GAUZE/BANDAGES/DRESSINGS) ×2 IMPLANT
GAUZE XEROFORM 1X8 LF (GAUZE/BANDAGES/DRESSINGS) ×2 IMPLANT
GLOVE BIOGEL PI IND STRL 8.5 (GLOVE) ×1 IMPLANT
GLOVE BIOGEL PI INDICATOR 8.5 (GLOVE) ×2
GLOVE SURG ORTHO 8.0 STRL STRW (GLOVE) ×3 IMPLANT
GOWN STRL REUS W/ TWL LRG LVL3 (GOWN DISPOSABLE) ×1 IMPLANT
GOWN STRL REUS W/TWL LRG LVL3 (GOWN DISPOSABLE) ×2
GOWN STRL REUS W/TWL XL LVL3 (GOWN DISPOSABLE) ×2 IMPLANT
NDL PRECISIONGLIDE 27X1.5 (NEEDLE) IMPLANT
NEEDLE PRECISIONGLIDE 27X1.5 (NEEDLE) IMPLANT
NS IRRIG 1000ML POUR BTL (IV SOLUTION) ×2 IMPLANT
PACK BASIN DAY SURGERY FS (CUSTOM PROCEDURE TRAY) ×2 IMPLANT
PAD CAST 3X4 CTTN HI CHSV (CAST SUPPLIES) IMPLANT
PADDING CAST ABS 3INX4YD NS (CAST SUPPLIES)
PADDING CAST ABS 4INX4YD NS (CAST SUPPLIES) ×1
PADDING CAST ABS COTTON 3X4 (CAST SUPPLIES) IMPLANT
PADDING CAST ABS COTTON 4X4 ST (CAST SUPPLIES) ×1 IMPLANT
PADDING CAST COTTON 3X4 STRL (CAST SUPPLIES)
SPLINT PLASTER CAST XFAST 3X15 (CAST SUPPLIES) IMPLANT
SPLINT PLASTER XTRA FASTSET 3X (CAST SUPPLIES)
STOCKINETTE 4X48 STRL (DRAPES) ×2 IMPLANT
SUT ETHILON 4 0 PS 2 18 (SUTURE) ×2 IMPLANT
SUT VIC AB 4-0 P2 18 (SUTURE) IMPLANT
SYR BULB 3OZ (MISCELLANEOUS) ×2 IMPLANT
SYR CONTROL 10ML LL (SYRINGE) IMPLANT
TOWEL GREEN STERILE FF (TOWEL DISPOSABLE) ×4 IMPLANT
UNDERPAD 30X36 HEAVY ABSORB (UNDERPADS AND DIAPERS) ×2 IMPLANT

## 2019-06-08 NOTE — Anesthesia Postprocedure Evaluation (Signed)
Anesthesia Post Note  Patient: Nichole Gill  Procedure(s) Performed: EXCISION CYST DEBRIDEMENT DISTAL INTERPHALANGEAL RIGHT MIDDLE FINGER (Right Hand)     Patient location during evaluation: PACU Anesthesia Type: MAC Level of consciousness: awake and alert Pain management: pain level controlled Vital Signs Assessment: post-procedure vital signs reviewed and stable Respiratory status: spontaneous breathing and respiratory function stable Cardiovascular status: stable Postop Assessment: no apparent nausea or vomiting Anesthetic complications: no    Last Vitals:  Vitals:   06/08/19 1430 06/08/19 1445  BP: 128/72 (!) 141/79  Pulse: (!) 58 93  Resp: 15 16  Temp:  36.6 C  SpO2: 100% 100%    Last Pain:  Vitals:   06/08/19 1445  TempSrc:   PainSc: 0-No pain                 Hibba Schram DANIEL

## 2019-06-08 NOTE — Discharge Instructions (Addendum)
Next dose of tylenol at [facility] Instructions Hand Surgery  Wound Care: Keep your hand elevated above the level of your heart.  Do not allow it to dangle by your side.  Keep the dressing dry and do not remove it unless your doctor advises you to do so.  He will usually change it at the time of your post-op visit.  Moving your fingers is advised to stimulate circulation but will depend on the site of your surgery.  If you have a splint applied, your doctor will advise you regarding movement.  Activity: Do not drive or operate machinery today.  Rest today and then you may return to your normal activity and work as indicated by your physician.  Diet:  Drink liquids today or eat a light diet.  You may resume a regular diet tomorrow.    General expectations: Pain for two to three days. Fingers may become slightly swollen.  Call your doctor if any of the following occur: Severe pain not relieved by pain medication. Elevated temperature. Dressing soaked with blood. Inability to move fingers. White or bluish color to fingers.    Post Anesthesia Home Care Instructions  Activity: Get plenty of rest for the remainder of the day. A responsible individual must stay with you for 24 hours following the procedure.  For the next 24 hours, DO NOT: -Drive a car -Paediatric nurse -Drink alcoholic beverages -Take any medication unless instructed by your physician -Make any legal decisions or sign important papers.  Meals: Start with liquid foods such as gelatin or soup. Progress to regular foods as tolerated. Avoid greasy, spicy, heavy foods. If nausea and/or vomiting occur, drink only clear liquids until the nausea and/or vomiting subsides. Call your physician if vomiting continues.  Special Instructions/Symptoms: Your throat may feel dry or sore from the anesthesia or the breathing tube placed in your throat during surgery. If this causes discomfort, gargle with warm salt water.  The discomfort should disappear within 24 hours.  If you had a scopolamine patch placed behind your ear for the management of post- operative nausea and/or vomiting:  1. The medication in the patch is effective for 72 hours, after which it should be removed.  Wrap patch in a tissue and discard in the trash. Wash hands thoroughly with soap and water. 2. You may remove the patch earlier than 72 hours if you experience unpleasant side effects which may include dry mouth, dizziness or visual disturbances. 3. Avoid touching the patch. Wash your hands with soap and water after contact with the patch.

## 2019-06-08 NOTE — Op Note (Signed)
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   ASSISTANT: none   ANESTHESIA:  Local with sedation   INTRAVENOUS FLUIDS:  Per anesthesia flow sheet.   ESTIMATED BLOOD LOSS:  Minimal.   COMPLICATIONS:  None.   SPECIMENS:   Excision of cyst synovium and osteophyte   TOURNIQUET TIME:   * No tourniquets in log *   DISPOSITION:  Stable to PACU.   INDICATIONS: Patient is a 63 year old female with a history of a mass at the distal inner phalangeal joint of her right middle finger.  This has groove the nail distally.  X-rays reveal degenerative arthritis of the distal phalangeal joint.  She is desirous of having this excised.  Pre-peripostoperative course are discussed along with risk complications.  She is aware there is no guarantee to the surgery the possibility of infection recurrence injury to arteries nerves tendons incomplete relief symptoms dystrophy.  Preoperative area the patient is seen the extremity marked by both patient and surgeon antibiotic given  OPERATIVE COURSE: Patient is brought to the operating room was placed in the supine position prepped and draped with ChloraPrep after metacarpal block was given with quarter percent bupivacaine and 1% Xylocaine without epinephrine this was done after a timeout was taken to confirm patient procedure.  A Penrose drain was used for tourniquet control at the base of the finger.  A curvilinear incision was made over the distal phalangeal joint carried down through subcutaneous tissue and this was  on the right middle finger.  A tunnel was then made distally under the skin to the level of the cyst which was isolated with a house curette and then removed with hemostatic rondure.  An incision was made on the ulnar aspect of the extensor tendon opening the joint.  This was then debrided of osteophytes with the house curette and hemostatic rondure.  Specimen was sent to pathology.  The wound was copiously irrigated with saline.  Skin was closed interrupted 4-0 nylon sutures. The Penrose drain was removed.  Finger immediately pink.  Sterile compressive dressing splint to the finger was applied.   She was taken to the recovery room for observation in satisfactory condition.  She will be discharged home to return to [facility] in 1 week Tylenol ibuprofen for pain with Ultram for breakthrough.  Daryll Brod, MD Electronically signed, 06/08/19

## 2019-06-08 NOTE — Anesthesia Preprocedure Evaluation (Addendum)
Anesthesia Evaluation  Patient identified by MRN, date of birth, ID band Patient awake    Reviewed: Allergy & Precautions, NPO status , Patient's Chart, lab work & pertinent test results  History of Anesthesia Complications Negative for: history of anesthetic complications  Airway Mallampati: II  TM Distance: >3 FB Neck ROM: Full    Dental no notable dental hx. (+) Dental Advisory Given   Pulmonary neg pulmonary ROS,    Pulmonary exam normal        Cardiovascular negative cardio ROS Normal cardiovascular exam     Neuro/Psych negative neurological ROS     GI/Hepatic negative GI ROS, Neg liver ROS,   Endo/Other  negative endocrine ROS  Renal/GU negative Renal ROS     Musculoskeletal negative musculoskeletal ROS (+)   Abdominal   Peds  Hematology negative hematology ROS (+)   Anesthesia Other Findings Day of surgery medications reviewed with the patient.  Reproductive/Obstetrics                            Anesthesia Physical Anesthesia Plan  ASA: II  Anesthesia Plan: MAC   Post-op Pain Management:    Induction:   PONV Risk Score and Plan: 2 and Ondansetron and Propofol infusion  Airway Management Planned: Simple Face Mask  Additional Equipment:   Intra-op Plan:   Post-operative Plan:   Informed Consent: I have reviewed the patients History and Physical, chart, labs and discussed the procedure including the risks, benefits and alternatives for the proposed anesthesia with the patient or authorized representative who has indicated his/her understanding and acceptance.     Dental advisory given  Plan Discussed with: Anesthesiologist and CRNA  Anesthesia Plan Comments:        Anesthesia Quick Evaluation

## 2019-06-08 NOTE — H&P (Signed)
  Nichole Gill is an 63 y.o. female.   Chief Complaint:mass right middle finger HPI: [name] is a 63 year old right-hand-dominant female comes in with a complaint of a mass of the distal phalangeal joint right middle finger. This has been present for 8 to 12 months. She has had these removed in the past by Dr. Suszanne Conners on other fingers of her left hand. She recalls no history of injury. She has not had any treatment for this. It is not causing her any pain or discomfort. She has no history of diabetes thyroid problems arthritis or gout. Family history is negative for each of these.     Past Medical History:  Diagnosis Date  . Fibroid 2007   3 cm  . Lactose intolerance   . Substance abuse ([place]) 2010   alcoholism    Past Surgical History:  Procedure Laterality Date  . ADENOIDECTOMY    . CESAREAN SECTION    . ENDOMETRIAL BIOPSY  05-25-06   -benign (Dr. Joan Flores)  . IRRIGATION AND DEBRIDEMENT ABSCESS Left 04/27/2013   Procedure: Minor DEBRIDEMENT DISTAL INTERPHALANGEAL JOINT, EXCISION MUCOID CYSTS LEFT LONG FINGER AND LEFT RING FINGER (MINOR PROCEDURE) ;  Surgeon: Cammie Sickle., MD;  Location: [facility];  Service: Orthopedics;  Laterality: Left;  PENROSE DRAIN USED FOR TOURNIQUET--ON LEFT RING AT 1309, OFF AT 1323. ON LEFT LONG FINGER AT 1325, OFF AT 1335.   Marland Kitchen NASAL SINUS SURGERY  2010  . TONSILLECTOMY      Family History  Problem Relation Age of Onset  . Osteoporosis Mother   . Allergic rhinitis Daughter   . Asthma Daughter   . Angioedema Neg Hx   . Atopy Neg Hx   . Urticaria Neg Hx   . Eczema Neg Hx    Social History:  reports that she has never smoked. She has never used smokeless tobacco. She reports that she does not drink alcohol or use drugs.  Allergies: No Known Allergies  No medications prior to admission.    No results found for this or any previous visit (from the past 48 hour(s)).  No results found.   Pertinent items are noted in  HPI.  Height 5\' 6"  (1.676 m), weight 70.3 kg.  General appearance: alert, cooperative and appears stated age Head: Normocephalic, without obvious abnormality Neck: no carotid bruit and no JVD Resp: clear to auscultation bilaterally Cardio: regular rate and rhythm, S1, S2 normal, no murmur, click, rub or gallop GI: soft, non-tender; bowel sounds normal; no masses,  no organomegaly Extremities: mass right middle finger Pulses: 2+ and symmetric Skin: Skin color, texture, turgor normal. No rashes or lesions Neurologic: Grossly normal Incision/Wound: na  Assessment/Plan Assessment:   Osteoarthritis of finger of right hand   Mucoid cyst, joint    Plan: We have discussed with her surgical excision of the mucoid cyst along with its etiology. She is well aware of risks and complications having had 2 removed in the past. Pre-peri-and postoperative course are discussed along with risk and complications. She is aware that there is no guarantee to the surgery the possibility of infection recurrence injury to arteries nerves tendons complete relief symptoms dystrophy. Is scheduled for excision mucoid cyst debridement distal phalangeal joint right middle finger as an outpatient under regional anesthesia.    Daryll Brod 06/08/2019, 5:37 AM

## 2019-06-08 NOTE — Brief Op Note (Signed)
06/08/2019  2:20 PM  PATIENT:  Nichole Gill  63 y.o. female  PRE-OPERATIVE DIAGNOSIS:  MUCOID TUMOR RIGHT MIDDLE FINGER  POST-OPERATIVE DIAGNOSIS:  MUCOID TUMOR RIGHT MIDDLE FINGER  PROCEDURE:  Procedure(s) with comments: EXCISION CYST DEBRIDEMENT DISTAL INTERPHALANGEAL RIGHT MIDDLE FINGER (Right) - FAB ANESTHESIA  SURGEON:  Surgeon(s) and Role:    * Daryll Brod, MD - Primary  PHYSICIAN ASSISTANT:   ASSISTANTS: none   ANESTHESIA:   local and IV sedation  EBL:  84ml  BLOOD ADMINISTERED:none  DRAINS: none   LOCAL MEDICATIONS USED:  BUPIVICAINE and Xylocaine  SPECIMEN:  Excision  DISPOSITION OF SPECIMEN:  PATHOLOGY  COUNTS:  YES  TOURNIQUET:  * Missing tourniquet times found for documented tourniquets in log: [id number] *  DICTATION: .Viviann Spare Dictation  PLAN OF CARE: Discharge to home after PACU  PATIENT DISPOSITION:  PACU - hemodynamically stable.

## 2019-06-08 NOTE — Transfer of Care (Signed)
Immediate Anesthesia Transfer of Care Note  Patient: Nichole Gill  Procedure(s) Performed: EXCISION CYST DEBRIDEMENT DISTAL INTERPHALANGEAL RIGHT MIDDLE FINGER (Right Hand)  Patient Location: PACU  Anesthesia Type:MAC  Level of Consciousness: awake, alert  and oriented  Airway & Oxygen Therapy: Patient Spontanous Breathing and Patient connected to nasal cannula oxygen  Post-op Assessment: Report given to RN and Post -op Vital signs reviewed and stable  Post vital signs: Reviewed and stable  Last Vitals:  Vitals Value Taken Time  BP 122/69 06/08/19 1422  Temp    Pulse 63 06/08/19 1423  Resp    SpO2 100 % 06/08/19 1423  Vitals shown include unvalidated device data.  Last Pain:  Vitals:   06/08/19 1204  TempSrc: Oral  PainSc: 0-No pain         Complications: No apparent anesthesia complications

## 2019-06-11 ENCOUNTER — Encounter: Payer: Self-pay | Admitting: *Deleted

## 2019-06-11 LAB — SURGICAL PATHOLOGY

## 2019-08-06 DIAGNOSIS — Z1231 Encounter for screening mammogram for malignant neoplasm of breast: Secondary | ICD-10-CM | POA: Diagnosis not present

## 2019-08-06 DIAGNOSIS — Z13820 Encounter for screening for osteoporosis: Secondary | ICD-10-CM | POA: Diagnosis not present

## 2019-08-13 DIAGNOSIS — Z01419 Encounter for gynecological examination (general) (routine) without abnormal findings: Secondary | ICD-10-CM | POA: Diagnosis not present

## 2019-09-10 DIAGNOSIS — Z713 Dietary counseling and surveillance: Secondary | ICD-10-CM | POA: Diagnosis not present

## 2019-09-10 DIAGNOSIS — Z0000 Encounter for general adult medical examination without abnormal findings: Secondary | ICD-10-CM | POA: Diagnosis not present

## 2019-09-13 ENCOUNTER — Ambulatory Visit: Payer: Federal, State, Local not specified - PPO | Attending: Internal Medicine

## 2019-09-13 DIAGNOSIS — Z23 Encounter for immunization: Secondary | ICD-10-CM

## 2019-09-13 NOTE — Progress Notes (Signed)
   Covid-19 Vaccination Clinic  Name:  Nichole Gill    MRN: [id number] DOB: 04-01-1956  09/13/2019  [name] was observed post Covid-19 immunization for 15 minutes without incident. She was provided with Vaccine Information Sheet and instruction to access the V-Safe system.   [name] was instructed to call 911 with any severe reactions post vaccine: Marland Kitchen Difficulty breathing  . Swelling of face and throat  . A fast heartbeat  . A bad rash all over body  . Dizziness and weakness   Immunizations Administered    Name Date Dose VIS Date Route   Pfizer COVID-19 Vaccine 09/13/2019  8:54 AM 0.3 mL 06/01/2019 Intramuscular   Manufacturer: [place]   Lot: [id number]   [place]: [id number]

## 2019-09-23 ENCOUNTER — Emergency Department (HOSPITAL_COMMUNITY): Payer: Federal, State, Local not specified - PPO

## 2019-09-23 ENCOUNTER — Other Ambulatory Visit: Payer: Self-pay

## 2019-09-23 ENCOUNTER — Inpatient Hospital Stay (HOSPITAL_COMMUNITY)
Admission: EM | Admit: 2019-09-23 | Discharge: 2019-09-24 | DRG: 069 | Disposition: A | Discharge status: Home / Self Care | Payer: Federal, State, Local not specified - PPO | Attending: Internal Medicine | Admitting: Internal Medicine

## 2019-09-23 ENCOUNTER — Encounter (HOSPITAL_COMMUNITY): Payer: Self-pay | Admitting: Internal Medicine

## 2019-09-23 ENCOUNTER — Inpatient Hospital Stay (HOSPITAL_COMMUNITY): Payer: Federal, State, Local not specified - PPO

## 2019-09-23 DIAGNOSIS — R29810 Facial weakness: Secondary | ICD-10-CM | POA: Diagnosis not present

## 2019-09-23 DIAGNOSIS — Z823 Family history of stroke: Secondary | ICD-10-CM | POA: Diagnosis not present

## 2019-09-23 DIAGNOSIS — R4781 Slurred speech: Secondary | ICD-10-CM | POA: Diagnosis not present

## 2019-09-23 DIAGNOSIS — Z20822 Contact with and (suspected) exposure to covid-19: Secondary | ICD-10-CM | POA: Diagnosis not present

## 2019-09-23 DIAGNOSIS — R29700 NIHSS score 0: Secondary | ICD-10-CM | POA: Diagnosis present

## 2019-09-23 DIAGNOSIS — Z825 Family history of asthma and other chronic lower respiratory diseases: Secondary | ICD-10-CM | POA: Diagnosis not present

## 2019-09-23 DIAGNOSIS — E739 Lactose intolerance, unspecified: Secondary | ICD-10-CM | POA: Diagnosis present

## 2019-09-23 DIAGNOSIS — G459 Transient cerebral ischemic attack, unspecified: Principal | ICD-10-CM | POA: Diagnosis present

## 2019-09-23 DIAGNOSIS — E785 Hyperlipidemia, unspecified: Secondary | ICD-10-CM | POA: Diagnosis not present

## 2019-09-23 DIAGNOSIS — Z8262 Family history of osteoporosis: Secondary | ICD-10-CM | POA: Diagnosis not present

## 2019-09-23 DIAGNOSIS — R29818 Other symptoms and signs involving the nervous system: Secondary | ICD-10-CM | POA: Diagnosis not present

## 2019-09-23 DIAGNOSIS — R471 Dysarthria and anarthria: Secondary | ICD-10-CM | POA: Diagnosis not present

## 2019-09-23 LAB — DIFFERENTIAL
Abs Immature Granulocytes: 0.01 10*3/uL (ref 0.00–0.07)
Basophils Absolute: 0 10*3/uL (ref 0.0–0.1)
Basophils Relative: 0 %
Eosinophils Absolute: 0.3 10*3/uL (ref 0.0–0.5)
Eosinophils Relative: 4 %
Immature Granulocytes: 0 %
Lymphocytes Relative: 46 %
Lymphs Abs: 3.4 10*3/uL (ref 0.7–4.0)
Monocytes Absolute: 0.3 10*3/uL (ref 0.1–1.0)
Monocytes Relative: 4 %
Neutro Abs: 3.5 10*3/uL (ref 1.7–7.7)
Neutrophils Relative %: 46 %

## 2019-09-23 LAB — I-STAT CHEM 8, ED
BUN: 12 mg/dL (ref 8–23)
Calcium, Ion: 1.14 mmol/L — ABNORMAL LOW (ref 1.15–1.40)
Chloride: 102 mmol/L (ref 98–111)
Creatinine, Ser: 0.9 mg/dL (ref 0.44–1.00)
Glucose, Bld: 118 mg/dL — ABNORMAL HIGH (ref 70–99)
HCT: 50 % — ABNORMAL HIGH (ref 36.0–46.0)
Hemoglobin: 17 g/dL — ABNORMAL HIGH (ref 12.0–15.0)
Potassium: 3.8 mmol/L (ref 3.5–5.1)
Sodium: 139 mmol/L (ref 135–145)
TCO2: 28 mmol/L (ref 22–32)

## 2019-09-23 LAB — COMPREHENSIVE METABOLIC PANEL
ALT: 17 U/L (ref 0–44)
AST: 16 U/L (ref 15–41)
Albumin: 4.4 g/dL (ref 3.5–5.0)
Alkaline Phosphatase: 62 U/L (ref 38–126)
Anion gap: 13 (ref 5–15)
BUN: 11 mg/dL (ref 8–23)
CO2: 24 mmol/L (ref 22–32)
Calcium: 9.4 mg/dL (ref 8.9–10.3)
Chloride: 101 mmol/L (ref 98–111)
Creatinine, Ser: 0.61 mg/dL (ref 0.44–1.00)
GFR calc Af Amer: >60 mL/min (ref >60)
GFR calc non Af Amer: >60 mL/min (ref >60)
Glucose, Bld: 116 mg/dL — ABNORMAL HIGH (ref 70–99)
Potassium: 4.1 mmol/L (ref 3.5–5.1)
Sodium: 138 mmol/L (ref 135–145)
Total Bilirubin: 0.5 mg/dL (ref 0.3–1.2)
Total Protein: 7.4 g/dL (ref 6.5–8.1)

## 2019-09-23 LAB — CBC
HCT: 49.1 % — ABNORMAL HIGH (ref 36.0–46.0)
Hemoglobin: 16.5 g/dL — ABNORMAL HIGH (ref 12.0–15.0)
MCH: 32.2 pg (ref 26.0–34.0)
MCHC: 33.6 g/dL (ref 30.0–36.0)
MCV: 95.9 fL (ref 80.0–100.0)
Platelets: 333 10*3/uL (ref 150–400)
RBC: 5.12 MIL/uL — ABNORMAL HIGH (ref 3.87–5.11)
RDW: 12.4 % (ref 11.5–15.5)
WBC: 7.5 10*3/uL (ref 4.0–10.5)
nRBC: 0 % (ref 0.0–0.2)

## 2019-09-23 LAB — SARS CORONAVIRUS 2 (TAT 6-24 HRS): SARS Coronavirus 2: NEGATIVE

## 2019-09-23 LAB — PROTIME-INR
INR: 0.9 (ref 0.8–1.2)
Prothrombin Time: 12.2 seconds (ref 11.4–15.2)

## 2019-09-23 LAB — CK: Total CK: <5 U/L — ABNORMAL LOW (ref 38–234)

## 2019-09-23 LAB — CBG MONITORING, ED: Glucose-Capillary: 111 mg/dL — ABNORMAL HIGH (ref 70–99)

## 2019-09-23 LAB — APTT: aPTT: 25 seconds (ref 24–36)

## 2019-09-23 MED ORDER — ACETAMINOPHEN 325 MG PO TABS: 650.0000 mg | ORAL_TABLET | ORAL | Status: DC | PRN

## 2019-09-23 MED ORDER — ASPIRIN EC 325 MG PO TBEC
650.0000 mg | DELAYED_RELEASE_TABLET | Freq: Once | ORAL | Status: AC
  Administered 2019-09-23: 650 mg via ORAL
  Filled 2019-09-23: qty 2

## 2019-09-23 MED ORDER — ENOXAPARIN SODIUM 40 MG/0.4ML ~~LOC~~ SOLN
40.0000 mg | SUBCUTANEOUS | Status: DC
  Administered 2019-09-24: 40 mg via SUBCUTANEOUS
  Filled 2019-09-23: qty 0.4

## 2019-09-23 MED ORDER — ACETAMINOPHEN 160 MG/5ML PO SOLN: 650.0000 mg | ORAL | Status: DC | PRN

## 2019-09-23 MED ORDER — ASPIRIN EC 81 MG PO TBEC
81.0000 mg | DELAYED_RELEASE_TABLET | Freq: Every day | ORAL | Status: DC
  Administered 2019-09-24: 81 mg via ORAL
  Filled 2019-09-23: qty 1

## 2019-09-23 MED ORDER — SODIUM CHLORIDE 0.9% FLUSH
3.0000 mL | Freq: Once | INTRAVENOUS | Status: AC
  Administered 2019-09-23: 3 mL via INTRAVENOUS

## 2019-09-23 MED ORDER — IOHEXOL 350 MG/ML SOLN
75.0000 mL | Freq: Once | INTRAVENOUS | Status: AC | PRN
  Administered 2019-09-23: 75 mL via INTRAVENOUS

## 2019-09-23 MED ORDER — ACETAMINOPHEN 650 MG RE SUPP: 650.0000 mg | RECTAL | Status: DC | PRN

## 2019-09-23 MED ORDER — STROKE: EARLY STAGES OF RECOVERY BOOK
Freq: Once | Status: AC
  Filled 2019-09-23: qty 1

## 2019-09-23 NOTE — ED Notes (Signed)
Activated code stroke with [facility] 

## 2019-09-23 NOTE — ED Triage Notes (Signed)
Pt arrived by POV with spouse.  Reports L sided facial droop and dysarthria.  LKW at 4pm.  States symptoms have improved but pt continues to have slight L sided facial droop.  No arm drift or leg weakness.  Speech clear.   NS notified to activate Code Stroke and Dr. Kathrynn Humble to bridge to assess pt.

## 2019-09-23 NOTE — H&P (Signed)
History and Physical    AVANA JANIAK [id number] DOB: [birth year] DOA: 09/23/2019  PCP: Kelton Pillar, MD Patient coming from: Home  Chief Complaint: Left-sided facial droop, dysarthria  HPI: Nichole Gill is a 64 y.o. female presented to the ED for assessment of acute onset left-sided facial droop and dysarthria.  Patient states at 4 PM she experienced acute onset left-sided facial droop and her speech was slow at that time.  States her husband is a physician and checked her strength.  She did not have any focal weakness or numbness.  Symptoms resolved after she was in the ED.  Denies history of prior stroke.  She does not smoke cigarettes.  Denies history of hypertension, hyperlipidemia, or diabetes.   ED Course: Head CT showing no acute intracranial hemorrhage or evidence of acute infarction.  CT angiogram head and neck negative for large vessel occlusion, hemodynamically significant stenosis, or evidence of dissection.  Neurology consulted.  Review of Systems:  All systems reviewed and apart from history of presenting illness, are negative.  Past Medical History:  Diagnosis Date  . Fibroid 2007   3 cm  . Lactose intolerance   . Substance abuse ([place]) 2010   alcoholism    Past Surgical History:  Procedure Laterality Date  . ADENOIDECTOMY    . CESAREAN SECTION    . CYST EXCISION Right 06/08/2019   Procedure: EXCISION CYST DEBRIDEMENT DISTAL INTERPHALANGEAL RIGHT MIDDLE FINGER;  Surgeon: Daryll Brod, MD;  Location: [place];  Service: Orthopedics;  Laterality: Right;  FAB ANESTHESIA  . ENDOMETRIAL BIOPSY  05-25-06   -benign (Dr. Joan Flores)  . IRRIGATION AND DEBRIDEMENT ABSCESS Left 04/27/2013   Procedure: Minor DEBRIDEMENT DISTAL INTERPHALANGEAL JOINT, EXCISION MUCOID CYSTS LEFT LONG FINGER AND LEFT RING FINGER (MINOR PROCEDURE) ;  Surgeon: Cammie Sickle., MD;  Location: [facility];  Service: Orthopedics;  Laterality: Left;  PENROSE DRAIN  USED FOR TOURNIQUET--ON LEFT RING AT 1309, OFF AT 1323. ON LEFT LONG FINGER AT 1325, OFF AT 1335.   Marland Kitchen NASAL SINUS SURGERY  2010  . TONSILLECTOMY       reports that she has never smoked. She has never used smokeless tobacco. She reports that she does not drink alcohol or use drugs.  No Known Allergies  Family History  Problem Relation Age of Onset  . Osteoporosis Mother   . Stroke Mother   . Heart Problems Mother   . Allergic rhinitis Daughter   . Asthma Daughter   . Heart Problems Father   . Angioedema Neg Hx   . Atopy Neg Hx   . Urticaria Neg Hx   . Eczema Neg Hx     Prior to Admission medications   Medication Sig Start Date End Date Taking? Authorizing Provider  ibuprofen (ADVIL,MOTRIN) 200 MG tablet Take 200 mg by mouth every 6 (six) hours as needed.    [provider]  loratadine (CLARITIN) 10 MG tablet Take 10 mg by mouth daily.    [provider]  Melatonin 10 MG TABS melatonin    [provider]  Multiple Vitamin (MULTI-VITAMIN DAILY PO) Multi Vitamin    [provider]  polycarbophil (FIBERCON) 625 MG tablet Take 625 mg by mouth daily.    [provider]  traMADol (ULTRAM) 50 MG tablet Take 1 tablet (50 mg total) by mouth every 6 (six) hours as needed. 06/08/19   Daryll Brod, MD    Physical Exam: Vitals:   09/23/19 1707 09/23/19 1740 09/23/19  1747 09/23/19 1915  BP: (!) 143/92 (!) 148/92    Pulse: 77 78  73  Resp: 16 14  14   Temp: 97.8 F (36.6 C)     TempSrc: Oral     SpO2: 97% 98%  96%  Weight:      Height:   5\' 6"  (1.676 m)     Physical Exam  Constitutional: She is oriented to person, place, and time. She appears well-developed and well-nourished. No distress.  HENT:  Head: Normocephalic.  Eyes: Right eye exhibits no discharge. Left eye exhibits no discharge.  Cardiovascular: Normal rate, regular rhythm and intact distal pulses.  Pulmonary/Chest: Effort normal and breath sounds normal. No respiratory  distress. She has no wheezes. She has no rales.  Abdominal: Soft. Bowel sounds are normal. She exhibits no distension. There is no abdominal tenderness. There is no guarding.  Musculoskeletal:        General: No edema.     Cervical back: Neck supple.  Neurological: She is alert and oriented to person, place, and time.  Speech fluent, tongue midline, no obvious facial droop Strength 5 out of 5 in bilateral upper and lower extremities. Sensation to light touch intact throughout.   Skin: Skin is warm and dry. She is not diaphoretic.     Labs on Admission: I have personally reviewed following labs and imaging studies  CBC: Recent Labs  Lab 09/23/19 1704 09/23/19 1716  WBC 7.5  --   NEUTROABS 3.5  --   HGB 16.5* 17.0*  HCT 49.1* 50.0*  MCV 95.9  --   PLT 333  --    Basic Metabolic Panel: Recent Labs  Lab 09/23/19 1704 09/23/19 1716  NA 138 139  K 4.1 3.8  CL 101 102  CO2 24  --   GLUCOSE 116* 118*  BUN 11 12  CREATININE 0.61 0.90  CALCIUM 9.4  --    GFR: Estimated Creatinine Clearance: 65.8 mL/min (by C-G formula based on SCr of 0.9 mg/dL). Liver Function Tests: Recent Labs  Lab 09/23/19 1704  AST 16  ALT 17  ALKPHOS 62  BILITOT 0.5  PROT 7.4  ALBUMIN 4.4   No results for input(s): LIPASE, AMYLASE in the last 168 hours. No results for input(s): AMMONIA in the last 168 hours. Coagulation Profile: Recent Labs  Lab 09/23/19 1704  INR 0.9   Cardiac Enzymes: Recent Labs  Lab 09/23/19 1704  CKTOTAL <5*   BNP (last 3 results) No results for input(s): PROBNP in the last 8760 hours. HbA1C: No results for input(s): HGBA1C in the last 72 hours. CBG: Recent Labs  Lab 09/23/19 1703  GLUCAP 111*   Lipid Profile: No results for input(s): CHOL, HDL, LDLCALC, TRIG, CHOLHDL, LDLDIRECT in the last 72 hours. Thyroid Function Tests: No results for input(s): TSH, T4TOTAL, FREET4, T3FREE, THYROIDAB in the last 72 hours. Anemia Panel: No results for input(s):  VITAMINB12, FOLATE, FERRITIN, TIBC, IRON, RETICCTPCT in the last 72 hours. Urine analysis: No results found for: COLORURINE, APPEARANCEUR, LABSPEC, PHURINE, GLUCOSEU, HGBUR, BILIRUBINUR, KETONESUR, PROTEINUR, UROBILINOGEN, NITRITE, LEUKOCYTESUR  Radiological Exams on Admission: CT Code Stroke CTA Head W/WO contrast  Result Date: 09/23/2019 CLINICAL DATA:  Left-sided weakness, slurred speech EXAM: CT ANGIOGRAPHY HEAD AND NECK TECHNIQUE: Multidetector CT imaging of the head and neck was performed using the standard protocol during bolus administration of intravenous contrast. Multiplanar CT image reconstructions and MIPs were obtained to evaluate the vascular anatomy. Carotid stenosis measurements (when applicable) are obtained utilizing NASCET criteria, using the  distal internal carotid diameter as the denominator. CONTRAST:  75mL OMNIPAQUE IOHEXOL 350 MG/ML SOLN COMPARISON:  None. FINDINGS: CTA NECK FINDINGS Aortic arch: Great vessel origins are patent. Right carotid system: Patent. No measurable stenosis or evidence of dissection. Left carotid system: Patent. No measurable stenosis or evidence of dissection. Vertebral arteries: Patent and nearly codominant. No measurable stenosis or evidence of dissection. Skeleton: Degenerative changes of the cervical spine, greatest at C4-C5 and C5-C6. Other neck: No mass or adenopathy. Upper chest: No apical lung mass. Review of the MIP images confirms the above findings CTA HEAD FINDINGS Anterior circulation: Intracranial internal carotid arteries are patent. Anterior and middle cerebral arteries are patent. Posterior circulation: Intracranial vertebral arteries, basilar artery, and posterior cerebral arteries are patent Venous sinuses: As permitted by contrast timing, patent. Review of the MIP images confirms the above findings IMPRESSION: No large vessel occlusion, hemodynamically significant stenosis, or evidence of dissection. Electronically Signed   By: Macy Mis M.D.   On: 09/23/2019 17:40   CT Code Stroke CTA Neck W/WO contrast  Result Date: 09/23/2019 CLINICAL DATA:  Left-sided weakness, slurred speech EXAM: CT ANGIOGRAPHY HEAD AND NECK TECHNIQUE: Multidetector CT imaging of the head and neck was performed using the standard protocol during bolus administration of intravenous contrast. Multiplanar CT image reconstructions and MIPs were obtained to evaluate the vascular anatomy. Carotid stenosis measurements (when applicable) are obtained utilizing NASCET criteria, using the distal internal carotid diameter as the denominator. CONTRAST:  35mL OMNIPAQUE IOHEXOL 350 MG/ML SOLN COMPARISON:  None. FINDINGS: CTA NECK FINDINGS Aortic arch: Great vessel origins are patent. Right carotid system: Patent. No measurable stenosis or evidence of dissection. Left carotid system: Patent. No measurable stenosis or evidence of dissection. Vertebral arteries: Patent and nearly codominant. No measurable stenosis or evidence of dissection. Skeleton: Degenerative changes of the cervical spine, greatest at C4-C5 and C5-C6. Other neck: No mass or adenopathy. Upper chest: No apical lung mass. Review of the MIP images confirms the above findings CTA HEAD FINDINGS Anterior circulation: Intracranial internal carotid arteries are patent. Anterior and middle cerebral arteries are patent. Posterior circulation: Intracranial vertebral arteries, basilar artery, and posterior cerebral arteries are patent Venous sinuses: As permitted by contrast timing, patent. Review of the MIP images confirms the above findings IMPRESSION: No large vessel occlusion, hemodynamically significant stenosis, or evidence of dissection. Electronically Signed   By: Macy Mis M.D.   On: 09/23/2019 17:40   CT HEAD CODE STROKE WO CONTRAST  Result Date: 09/23/2019 CLINICAL DATA:  Code stroke.  Left-sided weakness, slurred speech EXAM: CT HEAD WITHOUT CONTRAST TECHNIQUE: Contiguous axial images were obtained from the  base of the skull through the vertex without intravenous contrast. COMPARISON:  None. FINDINGS: Brain: There is no acute intracranial hemorrhage, mass effect, or edema. Normal gray-white differentiation is preserved. Ventricles and sulci are normal in size and configuration there is no extra-axial fluid collection Vascular: No hyperdense vessel. Skull: Unremarkable. Sinuses/Orbits: Diffuse mucosal thickening with layering fluid in the left maxillary sinus. There is evidence of prior sinonasal surgery. Orbits are unremarkable. Other: Minimal right mastoid tip opacification. ASPECTS ([place] Stroke Program Early CT Score) - Ganglionic level infarction (caudate, lentiform nuclei, internal capsule, insula, M1-M3 cortex): 7 - Supraganglionic infarction (M4-M6 cortex): 3 Total score (0-10 with 10 being normal): 10 IMPRESSION: No acute intracranial hemorrhage or evidence of acute infarction. ASPECT score is 10. These results were communicated to Dr. Cheral Marker at 5:15 pmon 4/4/2021by text page via the [facility] messaging system. Electronically Signed  By: Macy Mis M.D.   On: 09/23/2019 17:18    EKG: Independently reviewed.  Sinus rhythm, RSR prime in V1 and V2.  No prior EKG for comparison.  Assessment/Plan Principal Problem:   TIA (transient ischemic attack)   TIA vs acute stroke: Patient experienced acute onset left-sided facial droop and dysarthria at home.  Her symptoms had resolved at the time of evaluation by neurology.  NIHSS of 0.  Head CT negative for acute intracranial abnormality.  CTA head and neck negative for LVO.  Admit for stroke work-up.  Neurology recommendations listed below: -Telemetry monitoring -Allow for permissive hypertension for 24 hours -MRI of the brain without contrast -2D echocardiogram -Hemoglobin A1c, fasting lipid panel -Crushed aspirin 650 mg p.o. x1 now, then 81 mg p.o. daily thereafter -Stroke team to consider addition of a statin -Frequent neurochecks -PT, OT, speech  therapy. -Risk factor modification -N.p.o. until cleared by bedside swallow evaluation or formal speech evaluation  HIV screening: The patient falls between the ages of 13-64 and should be screened for HIV, therefore HIV testing ordered.  DVT prophylaxis: Lovenox Code Status: Full code Family Communication: No family at bedside. Disposition Plan: Anticipate discharge after stroke work-up is completed. Consults called: Neurology Admission status: It is my clinical opinion that admission to INPATIENT is reasonable and necessary because of the expectation that this patient will require hospital care that crosses at least 2 midnights to treat this condition based on the medical complexity of the problems presented.  Given the aforementioned information, the predictability of an adverse outcome is felt to be significant.  The medical decision making on this patient was of high complexity and the patient is at high risk for clinical deterioration, therefore this is a level 3 visit.  Shela Leff MD [facility]  If 7PM-7AM, please contact night-coverage [email or web address]  09/23/2019, 10:33 PM

## 2019-09-23 NOTE — Consult Note (Signed)
Referring Physician: Dr. Kathrynn Humble    Chief Complaint: Acute onset of left facial droop and dysarthria  HPI: Nichole Gill is an 64 y.o. female presenting to the ED via [name] with spouse for assessment of acute onset left facial droop and dysarthria. Time of symptom onset and LKN are the same: 4:00 PM. At the time of initial evaluation in Triage the patient's symptoms had improved but staff noticed a slight left sided facial droop. Code Stroke was activated.     The patient has never had a stroke or an MI. She has no recent bleeding. She is not taking a blood thinner.   LSN: 4:00 PM tPA Given: No: Symptoms resolved with negative neurological exam.   Past Medical History:  Diagnosis Date  . Fibroid 2007   3 cm  . Lactose intolerance   . Substance abuse ([place]) 2010   alcoholism    Past Surgical History:  Procedure Laterality Date  . ADENOIDECTOMY    . CESAREAN SECTION    . CYST EXCISION Right 06/08/2019   Procedure: EXCISION CYST DEBRIDEMENT DISTAL INTERPHALANGEAL RIGHT MIDDLE FINGER;  Surgeon: Daryll Brod, MD;  Location: [place];  Service: Orthopedics;  Laterality: Right;  FAB ANESTHESIA  . ENDOMETRIAL BIOPSY  05-25-06   -benign (Dr. Joan Flores)  . IRRIGATION AND DEBRIDEMENT ABSCESS Left 04/27/2013   Procedure: Minor DEBRIDEMENT DISTAL INTERPHALANGEAL JOINT, EXCISION MUCOID CYSTS LEFT LONG FINGER AND LEFT RING FINGER (MINOR PROCEDURE) ;  Surgeon: Cammie Sickle., MD;  Location: [facility];  Service: Orthopedics;  Laterality: Left;  PENROSE DRAIN USED FOR TOURNIQUET--ON LEFT RING AT 1309, OFF AT 1323. ON LEFT LONG FINGER AT 1325, OFF AT 1335.   Marland Kitchen NASAL SINUS SURGERY  2010  . TONSILLECTOMY      Family History  Problem Relation Age of Onset  . Osteoporosis Mother   . Allergic rhinitis Daughter   . Asthma Daughter   . Angioedema Neg Hx   . Atopy Neg Hx   . Urticaria Neg Hx   . Eczema Neg Hx    Social History:  reports that she has never smoked.  She has never used smokeless tobacco. She reports that she does not drink alcohol or use drugs.  Allergies: No Known Allergies  Home Medications: No current facility-administered medications on file prior to encounter.   Current Outpatient Medications on File Prior to Encounter  Medication Sig Dispense Refill  . ibuprofen (ADVIL,MOTRIN) 200 MG tablet Take 200 mg by mouth every 6 (six) hours as needed.    . loratadine (CLARITIN) 10 MG tablet Take 10 mg by mouth daily.    . Melatonin 10 MG TABS melatonin    . Multiple Vitamin (MULTI-VITAMIN DAILY PO) Multi Vitamin    . polycarbophil (FIBERCON) 625 MG tablet Take 625 mg by mouth daily.    . traMADol (ULTRAM) 50 MG tablet Take 1 tablet (50 mg total) by mouth every 6 (six) hours as needed. 20 tablet 0     ROS: As per HPI. On comprehensive ROS she denies any additional symptoms, including no SOB, CP, headache or neck pain.    Physical Examination: Weight 76 kg.  HEENT: [place]/AT Lungs: Respirations unlabored Ext: No edema  Neurologic Examination: Mental Status:  Alert, fully oriented, thought content appropriate.  Speech fluent with intact comprehension and naming. No dysarthria.  Cranial Nerves: II:  Visual fields intact. PERRL.  III,IV, VI: EOMI without nystagmus. No ptosis.  V,VII: No facial droop at rest or with grimace.  VIII: hearing intact to conversation IX,X: Palate rises symmetrically XI: Symmetric shoulder shrug XII: midline tongue extension  Motor: Right : Upper extremity   5/5    Left:     Upper extremity   5/5  Lower extremity   5/5     Lower extremity   5/5 No pronator drift.  Tone is normal.  Sensory: Temp and light touch intact throughout, bilaterally. No extinction to DSS.  Deep Tendon Reflexes:  2+ bilateral biceps and brachioradialis.  2+ bilateral patellae, 1+ bilateral achilles.  Plantars: Right: downgoing  Left: downgoing Cerebellar: No ataxia with FNF and H-S bilaterally  Gait: Deferred  Results for  orders placed or performed during the hospital encounter of 09/23/19 (from the past 48 hour(s))  CBG monitoring, ED     Status: Abnormal   Collection Time: 09/23/19  5:03 PM  Result Value Ref Range   Glucose-Capillary 111 (H) 70 - 99 mg/dL    Comment: Glucose reference range applies only to samples taken after fasting for at least 8 hours.  CBC     Status: Abnormal   Collection Time: 09/23/19  5:04 PM  Result Value Ref Range   WBC 7.5 4.0 - 10.5 K/uL   RBC 5.12 (H) 3.87 - 5.11 MIL/uL   Hemoglobin 16.5 (H) 12.0 - 15.0 g/dL   HCT 49.1 (H) 36.0 - 46.0 %   MCV 95.9 80.0 - 100.0 fL   MCH 32.2 26.0 - 34.0 pg   MCHC 33.6 30.0 - 36.0 g/dL   RDW 12.4 11.5 - 15.5 %   Platelets 333 150 - 400 K/uL   nRBC 0.0 0.0 - 0.2 %    Comment: Performed at [facility], [place] [address], [place], [place] [address]  Differential     Status: None   Collection Time: 09/23/19  5:04 PM  Result Value Ref Range   Neutrophils Relative % 46 %   Neutro Abs 3.5 1.7 - 7.7 K/uL   Lymphocytes Relative 46 %   Lymphs Abs 3.4 0.7 - 4.0 K/uL   Monocytes Relative 4 %   Monocytes Absolute 0.3 0.1 - 1.0 K/uL   Eosinophils Relative 4 %   Eosinophils Absolute 0.3 0.0 - 0.5 K/uL   Basophils Relative 0 %   Basophils Absolute 0.0 0.0 - 0.1 K/uL   Immature Granulocytes 0 %   Abs Immature Granulocytes 0.01 0.00 - 0.07 K/uL    Comment: Performed at [facility], [place] [address] [place], [place] [address]  I-stat chem 8, ED     Status: Abnormal   Collection Time: 09/23/19  5:16 PM  Result Value Ref Range   Sodium 139 135 - 145 mmol/L   Potassium 3.8 3.5 - 5.1 mmol/L   Chloride 102 98 - 111 mmol/L   BUN 12 8 - 23 mg/dL   Creatinine, Ser 0.90 0.44 - 1.00 mg/dL   Glucose, Bld 118 (H) 70 - 99 mg/dL    Comment: Glucose reference range applies only to samples taken after fasting for at least 8 hours.   Calcium, Ion 1.14 (L) 1.15 - 1.40 mmol/L   TCO2 28 22 - 32 mmol/L   Hemoglobin 17.0 (H) 12.0 - 15.0 g/dL   HCT 50.0  (H) 36.0 - 46.0 %   CT HEAD CODE STROKE WO CONTRAST  Result Date: 09/23/2019 CLINICAL DATA:  Code stroke.  Left-sided weakness, slurred speech EXAM: CT HEAD WITHOUT CONTRAST TECHNIQUE: Contiguous axial images were obtained from the base of the skull  through the vertex without intravenous contrast. COMPARISON:  None. FINDINGS: Brain: There is no acute intracranial hemorrhage, mass effect, or edema. Normal gray-white differentiation is preserved. Ventricles and sulci are normal in size and configuration there is no extra-axial fluid collection Vascular: No hyperdense vessel. Skull: Unremarkable. Sinuses/Orbits: Diffuse mucosal thickening with layering fluid in the left maxillary sinus. There is evidence of prior sinonasal surgery. Orbits are unremarkable. Other: Minimal right mastoid tip opacification. ASPECTS ([place] Stroke Program Early CT Score) - Ganglionic level infarction (caudate, lentiform nuclei, internal capsule, insula, M1-M3 cortex): 7 - Supraganglionic infarction (M4-M6 cortex): 3 Total score (0-10 with 10 being normal): 10 IMPRESSION: No acute intracranial hemorrhage or evidence of acute infarction. ASPECT score is 10. These results were communicated to Dr. Cheral Marker at 5:15 pmon 4/4/2021by text page via the [facility] messaging system. Electronically Signed   By: Macy Mis M.D.   On: 09/23/2019 17:18    Assessment: 64 y.o. female presenting to the ED after husband noted left facial droop and slurred speech at home.  1. Symptoms resolved at the time of examination while in CT. Exam is nonfocal with NIHSS of 0. 2. CT head negative for acute abnormality.  3. Stroke Risk Factors - None  Recommendations: 1. HgbA1c, fasting lipid panel 2. MRI of the brain without contrast 3. PT consult, OT consult, Speech consult 4. Echocardiogram 5. Permissive HTN x 24 hours 6. Prophylactic therapy- Crushed ASA 650 mg po x 1 now, then 81 mg po qd thereafter 7. Risk factor modification 8. Telemetry  monitoring 9. Frequent neuro checks 10. Stroke team to consider addition of a statin. CK level is being obtained.    @Electronically  signed: Dr. Kerney Elbe  09/23/2019, 5:21 PM

## 2019-09-23 NOTE — ED Notes (Signed)
Patient ambulated to restroom with steady gait.

## 2019-09-24 ENCOUNTER — Inpatient Hospital Stay (HOSPITAL_COMMUNITY): Payer: Federal, State, Local not specified - PPO

## 2019-09-24 DIAGNOSIS — G459 Transient cerebral ischemic attack, unspecified: Secondary | ICD-10-CM

## 2019-09-24 LAB — CBC
HCT: 47.2 % — ABNORMAL HIGH (ref 36.0–46.0)
Hemoglobin: 16 g/dL — ABNORMAL HIGH (ref 12.0–15.0)
MCH: 32.1 pg (ref 26.0–34.0)
MCHC: 33.9 g/dL (ref 30.0–36.0)
MCV: 94.6 fL (ref 80.0–100.0)
Platelets: 332 10*3/uL (ref 150–400)
RBC: 4.99 MIL/uL (ref 3.87–5.11)
RDW: 12.3 % (ref 11.5–15.5)
WBC: 6.9 10*3/uL (ref 4.0–10.5)
nRBC: 0 % (ref 0.0–0.2)

## 2019-09-24 LAB — HEMOGLOBIN A1C
Hgb A1c MFr Bld: 5.6 % (ref 4.8–5.6)
Mean Plasma Glucose: 114.02 mg/dL

## 2019-09-24 LAB — LIPID PANEL
Cholesterol: 279 mg/dL — ABNORMAL HIGH (ref 0–200)
HDL: 75 mg/dL (ref >40)
LDL Cholesterol: 185 mg/dL — ABNORMAL HIGH (ref 0–99)
Total CHOL/HDL Ratio: 3.7 RATIO
Triglycerides: 96 mg/dL (ref <150)
VLDL: 19 mg/dL (ref 0–40)

## 2019-09-24 LAB — HIV ANTIBODY (ROUTINE TESTING W REFLEX): HIV Screen 4th Generation wRfx: NONREACTIVE

## 2019-09-24 LAB — ECHOCARDIOGRAM COMPLETE
Height: 66 in
Weight: 2606.72 oz

## 2019-09-24 MED ORDER — ATORVASTATIN CALCIUM 80 MG PO TABS: 80.0000 mg | ORAL_TABLET | Freq: Every day | ORAL | 0 refills | Status: AC

## 2019-09-24 MED ORDER — ATORVASTATIN CALCIUM 80 MG PO TABS: 80.0000 mg | ORAL_TABLET | Freq: Every day | ORAL | Status: DC

## 2019-09-24 MED ORDER — CLOPIDOGREL BISULFATE 75 MG PO TABS
75.0000 mg | ORAL_TABLET | Freq: Every day | ORAL | Status: DC
  Administered 2019-09-24: 75 mg via ORAL
  Filled 2019-09-24: qty 1

## 2019-09-24 MED ORDER — ASPIRIN 81 MG PO TBEC: 81.0000 mg | DELAYED_RELEASE_TABLET | Freq: Every day | ORAL | 0 refills | Status: AC

## 2019-09-24 MED ORDER — CLOPIDOGREL BISULFATE 75 MG PO TABS: 75.0000 mg | ORAL_TABLET | Freq: Every day | ORAL | 0 refills | Status: AC

## 2019-09-24 NOTE — Progress Notes (Signed)
  Echocardiogram 2D Echocardiogram has been performed.  Nichole Gill 09/24/2019, 10:16 AM

## 2019-09-24 NOTE — Progress Notes (Signed)
PT Cancellation Note  Patient Details Name: Nichole Gill MRN: [id number] DOB: 1956/02/09   Cancelled Treatment:    Reason Eval/Treat Not Completed: PT screened, no needs identified, will sign off. Patient reports she is back to baseline. Getting up independently to go to the bathroom.    [name] 09/24/2019, 9:31 AM

## 2019-09-24 NOTE — Plan of Care (Signed)
Pt's care plan goals met. Pt adequate for discharge.

## 2019-09-24 NOTE — Discharge Summary (Signed)
Physician Discharge Summary  Nichole Gill [id number] DOB: [birth year] DOA: 09/23/2019  PCP: Kelton Pillar, MD  Admit date: 09/23/2019 Discharge date: 09/24/2019  Admitted From: Home Disposition: Home  Recommendations for Outpatient Follow-up:  1. Follow up with PCP in 1-2 weeks 2. Please obtain BMP/CBC in one week your next doctors visit.  3. Aspirin and Plavix for 3 weeks followed by aspirin 4. Follow-up outpatient neurology in 3-4 weeks 5. Take Lipitor as prescribed  Discharge Condition: Stable CODE STATUS: Full code Diet recommendation: Heart healthy  Brief/Interim Summary: 64 y.o. female presented to the ED for assessment of acute onset left-sided facial droop and dysarthria.  Patient states at 4 PM she experienced acute onset left-sided facial droop and her speech was slow at that time.  Upon arrival in the ER her symptoms are pretty much resolved.  CT of the head was negative.  CTA of the head and neck did not show any acute large vessel occlusion.  Patient was seen by neurology who recommended aspirin and Plavix for 3 weeks followed by aspirin alone.  LDL was 185, A1c was 5.6.  Started on statin. Seen by physical therapy-no needs necessary.  Left-sided facial droop/dysarthria -TIA.  Currently she is asymptomatic.  CT of the head and CTA of the head and neck are negative.  MRI is unremarkable. -Aspirin and Plavix for 3 weeks followed by aspirin alone. -LDL 185, A1c 5.6 -Statin. D/C once cleared by Neurology.   Hyperlipidemia -LDL 185, statin on discharge   Discharge Diagnoses:  Principal Problem:   TIA (transient ischemic attack)    Consultations:  Neurology  Subjective: No symptoms this morning, feels great.  Discharge Exam: Vitals:   09/24/19 0541 09/24/19 0847  BP: (!) 165/90 (!) 150/85  Pulse: 80 88  Resp: 16 19  Temp: 98 F (36.7 C) 97.6 F (36.4 C)  SpO2: 98% 97%   Vitals:   09/24/19 0232 09/24/19 0245 09/24/19 0541 09/24/19 0847  BP:   (!)  165/90 (!) 150/85  Pulse: 69 74 80 88  Resp: 18 16 16 19   Temp:   98 F (36.7 C) 97.6 F (36.4 C)  TempSrc:   Oral   SpO2: 96% 97% 98% 97%  Weight:   73.9 kg   Height:   5\' 6"  (1.676 m)     General: Pt is alert, awake, not in acute distress Cardiovascular: RRR, S1/S2 +, no rubs, no gallops Respiratory: CTA bilaterally, no wheezing, no rhonchi Abdominal: Soft, NT, ND, bowel sounds + Extremities: no edema, no cyanosis  Discharge Instructions  Discharge Instructions    Ambulatory referral to Neurology   Complete by: As directed    Follow up in stroke clinic at [facility] with Frann Rider, NP in about 4 weeks. If not available, consider Dr. Antony Contras, Dr. Bess Harvest, or Dr. Sarina Ill.     Allergies as of 09/24/2019   No Known Allergies     Medication List    STOP taking these medications   traMADol 50 MG tablet Commonly known as: Ultram     TAKE these medications   Alavert 10 MG dissolvable tablet Generic drug: loratadine Take 10 mg by mouth daily.   aspirin 81 MG EC tablet Take 1 tablet (81 mg total) by mouth daily. Start taking on: September 25, 2019   atorvastatin 80 MG tablet Commonly known as: LIPITOR Take 1 tablet (80 mg total) by mouth daily at 6 PM.   clopidogrel 75 MG tablet Commonly known as: PLAVIX  Take 1 tablet (75 mg total) by mouth daily for 21 days. Start taking on: September 25, 2019   Melatonin 10 MG Tabs Take 10 mg by mouth at bedtime.   VITAMIN D PO Take 1 tablet by mouth daily.      Follow-up Information    [facility] Follow up in 4 week(s).   Specialty: Neurology Why: stroke clinic. office will call with appt date and time Contact information: [place] [phone]       Kelton Pillar, MD Follow up in 2 week(s).   Specialty: Family Medicine Contact information: [address] [place] [address] [phone]           No Known Allergies  You were cared for by a hospitalist during your hospital stay. If you have any questions about your discharge medications or the care you received while you were in the hospital after you are discharged, you can call the unit and asked to speak with the hospitalist on call if the hospitalist that took care of you is not available. Once you are discharged, your primary care physician will handle any further medical issues. Please note that no refills for any discharge medications will be authorized once you are discharged, as it is imperative that you return to your primary care physician (or establish a relationship with a primary care physician if you do not have one) for your aftercare needs so that they can reassess your need for medications and monitor your lab values.   Procedures/Studies: CT Code Stroke CTA Head W/WO contrast  Result Date: 09/23/2019 CLINICAL DATA:  Left-sided weakness, slurred speech EXAM: CT ANGIOGRAPHY HEAD AND NECK TECHNIQUE: Multidetector CT imaging of the head and neck was performed using the standard protocol during bolus administration of intravenous contrast. Multiplanar CT image reconstructions and MIPs were obtained to evaluate the vascular anatomy. Carotid stenosis measurements (when applicable) are obtained utilizing NASCET criteria, using the distal internal carotid diameter as the denominator. CONTRAST:  63mL OMNIPAQUE IOHEXOL 350 MG/ML SOLN COMPARISON:  None. FINDINGS: CTA NECK FINDINGS Aortic arch: Great vessel origins are patent. Right carotid system: Patent. No measurable stenosis or evidence of dissection. Left carotid system: Patent. No measurable stenosis or evidence of dissection. Vertebral arteries: Patent and nearly codominant. No measurable stenosis or evidence of dissection. Skeleton: Degenerative changes of the cervical spine, greatest at C4-C5 and C5-C6. Other neck: No mass or adenopathy. Upper chest: No apical lung mass. Review  of the MIP images confirms the above findings CTA HEAD FINDINGS Anterior circulation: Intracranial internal carotid arteries are patent. Anterior and middle cerebral arteries are patent. Posterior circulation: Intracranial vertebral arteries, basilar artery, and posterior cerebral arteries are patent Venous sinuses: As permitted by contrast timing, patent. Review of the MIP images confirms the above findings IMPRESSION: No large vessel occlusion, hemodynamically significant stenosis, or evidence of dissection. Electronically Signed   By: Macy Mis M.D.   On: 09/23/2019 17:40   CT Code Stroke CTA Neck W/WO contrast  Result Date: 09/23/2019 CLINICAL DATA:  Left-sided weakness, slurred speech EXAM: CT ANGIOGRAPHY HEAD AND NECK TECHNIQUE: Multidetector CT imaging of the head and neck was performed using the standard protocol during bolus administration of intravenous contrast. Multiplanar CT image reconstructions and MIPs were obtained to evaluate the vascular anatomy. Carotid stenosis measurements (when applicable) are obtained utilizing NASCET criteria, using the distal internal carotid diameter as the denominator. CONTRAST:  15mL OMNIPAQUE IOHEXOL 350 MG/ML SOLN COMPARISON:  None. FINDINGS: CTA NECK FINDINGS Aortic arch: Great vessel origins are patent. Right carotid system: Patent. No measurable stenosis or evidence of dissection. Left carotid system: Patent. No measurable stenosis or evidence of dissection. Vertebral arteries: Patent and nearly codominant. No measurable stenosis or evidence of dissection. Skeleton: Degenerative changes of the cervical spine, greatest at C4-C5 and C5-C6. Other neck: No mass or adenopathy. Upper chest: No apical lung mass. Review of the MIP images confirms the above findings CTA HEAD FINDINGS Anterior circulation: Intracranial internal carotid arteries are patent. Anterior and middle cerebral arteries are patent. Posterior circulation: Intracranial vertebral arteries, basilar  artery, and posterior cerebral arteries are patent Venous sinuses: As permitted by contrast timing, patent. Review of the MIP images confirms the above findings IMPRESSION: No large vessel occlusion, hemodynamically significant stenosis, or evidence of dissection. Electronically Signed   By: Macy Mis M.D.   On: 09/23/2019 17:40   MR BRAIN WO CONTRAST  Result Date: 09/24/2019 CLINICAL DATA:  Left-sided facial droop EXAM: MRI HEAD WITHOUT CONTRAST TECHNIQUE: Multiplanar, multiecho pulse sequences of the brain and surrounding structures were obtained without intravenous contrast. COMPARISON:  None. FINDINGS: BRAIN: No acute infarct, acute hemorrhage or extra-axial collection. Normal white matter signal for age. Normal volume of brain parenchyma and CSF spaces. Midline structures are normal. VASCULAR: Major flow voids are preserved. Susceptibility-sensitive sequences show no chronic microhemorrhage or superficial siderosis. SKULL AND UPPER CERVICAL SPINE: Normal calvarium and skull base. Visualized upper cervical spine and soft tissues are normal. SINUSES/ORBITS: Sphenoid sinus mucosal thickening. Small amount of right mastoid fluid. Normal orbits. IMPRESSION: Normal brain MRI. Electronically Signed   By: Ulyses Jarred M.D.   On: 09/24/2019 00:18   ECHOCARDIOGRAM COMPLETE  Result Date: 09/24/2019    ECHOCARDIOGRAM REPORT   Patient Name:   REAM ALLEYNE Suess Date of Exam: 09/24/2019 Medical Rec #:  [id number]         Height:       66.0 in Accession #:    [id number]        Weight:       162.9 lb Date of Birth:  1955/07/24         BSA:          1.833 m Patient Age:    31 years          BP:           150/85 mmHg Patient Gender: F                 HR:           76 bpm. Exam Location:  Inpatient Procedure: 2D Echo Indications:    TIA 435.9  History:        Patient has no prior history of Echocardiogram examinations.  Sonographer:    Johny Chess Referring Phys: [id number] [place]  1. Left  ventricular ejection fraction, by estimation, is 55 to 60%. The left ventricle has normal function. The left ventricle has no regional wall motion abnormalities. Left ventricular diastolic function could not be evaluated.  2. Right ventricular systolic function is normal. The right ventricular size is normal. There is normal pulmonary artery systolic pressure. The estimated right ventricular systolic pressure is Q000111Q mmHg.  3. The mitral valve is grossly normal. No evidence of mitral valve regurgitation. No evidence of mitral stenosis.  4. The aortic valve is tricuspid. Aortic valve regurgitation is not visualized. No aortic stenosis is present.  5. The inferior vena cava is normal in size  with greater than 50% respiratory variability, suggesting right atrial pressure of 3 mmHg. Conclusion(s)/Recommendation(s): No intracardiac source of embolism detected on this transthoracic study. A transesophageal echocardiogram is recommended to exclude cardiac source of embolism if clinically indicated. FINDINGS  Left Ventricle: Left ventricular ejection fraction, by estimation, is 55 to 60%. The left ventricle has normal function. The left ventricle has no regional wall motion abnormalities. The left ventricular internal cavity size was normal in size. There is  no left ventricular hypertrophy. Left ventricular diastolic function could not be evaluated due to nondiagnostic images. Left ventricular diastolic function could not be evaluated. Right Ventricle: The right ventricular size is normal. No increase in right ventricular wall thickness. Right ventricular systolic function is normal. There is normal pulmonary artery systolic pressure. The tricuspid regurgitant velocity is 2.12 m/s, and  with an assumed right atrial pressure of 3 mmHg, the estimated right ventricular systolic pressure is Q000111Q mmHg. Left Atrium: Left atrial size was normal in size. Right Atrium: Right atrial size was normal in size. Pericardium: Trivial  pericardial effusion is present. Presence of pericardial fat pad. Mitral Valve: The mitral valve is grossly normal. 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 No evidence of mitral valve regurgitation. No evidence of mitral valve stenosis. Tricuspid Valve: The tricuspid valve is grossly normal. Tricuspid valve regurgitation is not demonstrated. No evidence of tricuspid stenosis. Aortic Valve: The aortic valve is tricuspid. Aortic valve regurgitation is not visualized. No aortic stenosis is present. Pulmonic Valve: The pulmonic valve was grossly normal. Pulmonic valve regurgitation is not visualized. No evidence of pulmonic stenosis. Aorta: The aortic root is normal in size and structure. Venous: The inferior vena cava is normal in size with greater than 50% respiratory variability, suggesting right atrial pressure of 3 mmHg. IAS/Shunts: The atrial septum is grossly normal.  LEFT VENTRICLE PLAX 2D LVIDd:         4.90 cm LVIDs:         3.50 cm LV PW:         1.00 cm LV IVS:        0.90 cm LVOT diam:     2.00 cm LV SV:         50 LV SV Index:   27 LVOT Area:     3.14 cm  LV Volumes (MOD) LV vol d, MOD A2C: 56.1 ml LV vol d, MOD A4C: 61.5 ml LV vol s, MOD A2C: 30.5 ml LV vol s, MOD A4C: 34.8 ml LV SV MOD A2C:     25.6 ml LV SV MOD A4C:     61.5 ml LV SV MOD BP:      26.7 ml RIGHT VENTRICLE RV S prime:     14.70 cm/s TAPSE (M-mode): 2.5 cm LEFT ATRIUM             Index       RIGHT ATRIUM           Index LA diam:        3.20 cm 1.75 cm/m  RA Area:     13.40 cm LA Vol (A2C):   36.1 ml 19.70 ml/m RA Volume:   31.30 ml  17.08 ml/m LA Vol (A4C):   31.9 ml 17.40 ml/m LA Biplane Vol: 34.6 ml 18.88 ml/m  AORTIC VALVE LVOT Vmax:   73.80 cm/s LVOT Vmean:  48.300 cm/s LVOT VTI:    0.158 m  AORTA Ao Root diam: 3.10 cm MV E velocity: 6.74  cm/s  TRICUSPID VALVE MV A velocity: 9.36 cm/s  TR Peak grad:   18.0 mmHg MV E/A ratio:  0.72       TR Vmax:        212.00 cm/s                             SHUNTS                           Systemic VTI:  0.16 m                           Systemic Diam: 2.00 cm Eleonore Chiquito MD Electronically signed by Eleonore Chiquito MD Signature Date/Time: 09/24/2019/2:31:56 PM    Final    CT HEAD CODE STROKE WO CONTRAST  Result Date: 09/23/2019 CLINICAL DATA:  Code stroke.  Left-sided weakness, slurred speech EXAM: CT HEAD WITHOUT CONTRAST TECHNIQUE: Contiguous axial images were obtained from the base of the skull through the vertex without intravenous contrast. COMPARISON:  None. FINDINGS: Brain: There is no acute intracranial hemorrhage, mass effect, or edema. Normal gray-white differentiation is preserved. Ventricles and sulci are normal in size and configuration there is no extra-axial fluid collection Vascular: No hyperdense vessel. Skull: Unremarkable. Sinuses/Orbits: Diffuse mucosal thickening with layering fluid in the left maxillary sinus. There is evidence of prior sinonasal surgery. Orbits are unremarkable. Other: Minimal right mastoid tip opacification. ASPECTS ([place] Stroke Program Early CT Score) - Ganglionic level infarction (caudate, lentiform nuclei, internal capsule, insula, M1-M3 cortex): 7 - Supraganglionic infarction (M4-M6 cortex): 3 Total score (0-10 with 10 being normal): 10 IMPRESSION: No acute intracranial hemorrhage or evidence of acute infarction. ASPECT score is 10. These results were communicated to Dr. Cheral Marker at 5:15 pmon 4/4/2021by text page via the [facility] messaging system. Electronically Signed   By: Macy Mis M.D.   On: 09/23/2019 17:18     The results of significant diagnostics from this hospitalization (including imaging, microbiology, ancillary and laboratory) are listed below for reference.     Microbiology: Recent Results (from the past 240 hour(s))  SARS CORONAVIRUS 2 (TAT 6-24 HRS) Nasopharyngeal Nasopharyngeal Swab     Status: None   Collection Time: 09/23/19  7:21 PM   Specimen: Nasopharyngeal Swab  Result Value Ref Range  Status   SARS Coronavirus 2 NEGATIVE NEGATIVE Final    Comment: (NOTE) SARS-CoV-2 target nucleic acids are NOT DETECTED. The SARS-CoV-2 RNA is generally detectable in upper and lower respiratory specimens during the acute phase of infection. Negative results do not preclude SARS-CoV-2 infection, do not rule out co-infections with other pathogens, and should not be used as the sole basis for treatment or other patient management decisions. Negative results must be combined with clinical observations, patient history, and epidemiological information. The expected result is Negative. Fact Sheet for Patients: SugarRoll.be Fact Sheet for Healthcare Providers: https://www.woods-mathews.com/ This test is not yet approved or cleared by the [place] FDA and  has been authorized for detection and/or diagnosis of SARS-CoV-2 by FDA under an Emergency Use Authorization (EUA). This EUA will remain  in effect (meaning this test can be used) for the duration of the COVID-19 declaration under Section 56 4(b)(1) of the Act, 21 U.S.C. section 360bbb-3(b)(1), unless the authorization is terminated or revoked sooner. Performed at [facility], [place] [address], [place], [place] [address]      Labs:  BNP (last 3 results) No results for input(s): BNP in the last 8760 hours. Basic Metabolic Panel: Recent Labs  Lab 09/23/19 1704 09/23/19 1716  NA 138 139  K 4.1 3.8  CL 101 102  CO2 24  --   GLUCOSE 116* 118*  BUN 11 12  CREATININE 0.61 0.90  CALCIUM 9.4  --    Liver Function Tests: Recent Labs  Lab 09/23/19 1704  AST 16  ALT 17  ALKPHOS 62  BILITOT 0.5  PROT 7.4  ALBUMIN 4.4   No results for input(s): LIPASE, AMYLASE in the last 168 hours. No results for input(s): AMMONIA in the last 168 hours. CBC: Recent Labs  Lab 09/23/19 1704 09/23/19 1716 09/24/19 0318  WBC 7.5  --  6.9  NEUTROABS 3.5  --   --   HGB 16.5* 17.0* 16.0*  HCT  49.1* 50.0* 47.2*  MCV 95.9  --  94.6  PLT 333  --  332   Cardiac Enzymes: Recent Labs  Lab 09/23/19 1704  CKTOTAL <5*   BNP: Invalid input(s): POCBNP CBG: Recent Labs  Lab 09/23/19 1703  GLUCAP 111*   D-Dimer No results for input(s): DDIMER in the last 72 hours. Hgb A1c Recent Labs    09/24/19 0318  HGBA1C 5.6   Lipid Profile Recent Labs    09/24/19 0318  CHOL 279*  HDL 75  LDLCALC 185*  TRIG 96  CHOLHDL 3.7   Thyroid function studies No results for input(s): TSH, T4TOTAL, T3FREE, THYROIDAB in the last 72 hours.  Invalid input(s): FREET3 Anemia work up No results for input(s): VITAMINB12, FOLATE, FERRITIN, TIBC, IRON, RETICCTPCT in the last 72 hours. Urinalysis No results found for: COLORURINE, APPEARANCEUR, [place], [place], [place], [place], [place], [place], PROTEINUR, UROBILINOGEN, NITRITE, LEUKOCYTESUR Sepsis Labs Invalid input(s): PROCALCITONIN,  WBC,  LACTICIDVEN Microbiology Recent Results (from the past 240 hour(s))  SARS CORONAVIRUS 2 (TAT 6-24 HRS) Nasopharyngeal Nasopharyngeal Swab     Status: None   Collection Time: 09/23/19  7:21 PM   Specimen: Nasopharyngeal Swab  Result Value Ref Range Status   SARS Coronavirus 2 NEGATIVE NEGATIVE Final    Comment: (NOTE) SARS-CoV-2 target nucleic acids are NOT DETECTED. The SARS-CoV-2 RNA is generally detectable in upper and lower respiratory specimens during the acute phase of infection. Negative results do not preclude SARS-CoV-2 infection, do not rule out co-infections with other pathogens, and should not be used as the sole basis for treatment or other patient management decisions. Negative results must be combined with clinical observations, patient history, and epidemiological information. The expected result is Negative. Fact Sheet for Patients: SugarRoll.be Fact Sheet for Healthcare Providers: https://www.woods-mathews.com/ This test is not yet  approved or cleared by the [place] FDA and  has been authorized for detection and/or diagnosis of SARS-CoV-2 by FDA under an Emergency Use Authorization (EUA). This EUA will remain  in effect (meaning this test can be used) for the duration of the COVID-19 declaration under Section 56 4(b)(1) of the Act, 21 U.S.C. section 360bbb-3(b)(1), unless the authorization is terminated or revoked sooner. Performed at [facility], [place] [address], [place], [place] [address]      Time coordinating discharge:  I have spent 35 minutes face to face with the patient and on the ward discussing the patients care, assessment, plan and disposition with other care givers. >50% of the time was devoted counseling the patient about the risks and benefits of treatment/Discharge disposition and coordinating care.   SIGNED:   Delonta Yohannes Chirag  Reesa Chew, MD  [facility] 09/24/2019, 2:46 PM   If 7PM-7AM, please contact night-coverage

## 2019-09-24 NOTE — Progress Notes (Signed)
STROKE TEAM PROGRESS NOTE   INTERVAL HISTORY Husband at bedside.  Patient lying in bed, comfortably, stating that her slurred speech and facial weakness all resolved.  Stroke work-up completed, MRI negative.  LDL high.  Vitals:   09/24/19 0232 09/24/19 0245 09/24/19 0541 09/24/19 0847  BP:   (!) 165/90 (!) 150/85  Pulse: 69 74 80 88  Resp: 18 16 16 19   Temp:   98 F (36.7 C) 97.6 F (36.4 C)  TempSrc:   Oral   SpO2: 96% 97% 98% 97%  Weight:   73.9 kg   Height:   5\' 6"  (1.676 m)     CBC:  Recent Labs  Lab 09/23/19 1704 09/23/19 1704 09/23/19 1716 09/24/19 0318  WBC 7.5  --   --  6.9  NEUTROABS 3.5  --   --   --   HGB 16.5*   < > 17.0* 16.0*  HCT 49.1*   < > 50.0* 47.2*  MCV 95.9  --   --  94.6  PLT 333  --   --  332   < > = values in this interval not displayed.    Basic Metabolic Panel:  Recent Labs  Lab 09/23/19 1704 09/23/19 1716  NA 138 139  K 4.1 3.8  CL 101 102  CO2 24  --   GLUCOSE 116* 118*  BUN 11 12  CREATININE 0.61 0.90  CALCIUM 9.4  --    Lipid Panel:     Component Value Date/Time   CHOL 279 (H) 09/24/2019 0318   TRIG 96 09/24/2019 0318   HDL 75 09/24/2019 0318   CHOLHDL 3.7 09/24/2019 0318   VLDL 19 09/24/2019 0318   LDLCALC 185 (H) 09/24/2019 0318   HgbA1c:  Lab Results  Component Value Date   HGBA1C 5.6 09/24/2019   Urine Drug Screen: No results found for: LABOPIA, COCAINSCRNUR, LABBENZ, AMPHETMU, THCU, LABBARB  Alcohol Level No results found for: ETH  IMAGING past 24 hours CT Code Stroke CTA Head W/WO contrast  Result Date: 09/23/2019 CLINICAL DATA:  Left-sided weakness, slurred speech EXAM: CT ANGIOGRAPHY HEAD AND NECK TECHNIQUE: Multidetector CT imaging of the head and neck was performed using the standard protocol during bolus administration of intravenous contrast. Multiplanar CT image reconstructions and MIPs were obtained to evaluate the vascular anatomy. Carotid stenosis measurements (when applicable) are obtained utilizing  NASCET criteria, using the distal internal carotid diameter as the denominator. CONTRAST:  74mL OMNIPAQUE IOHEXOL 350 MG/ML SOLN COMPARISON:  None. FINDINGS: CTA NECK FINDINGS Aortic arch: Great vessel origins are patent. Right carotid system: Patent. No measurable stenosis or evidence of dissection. Left carotid system: Patent. No measurable stenosis or evidence of dissection. Vertebral arteries: Patent and nearly codominant. No measurable stenosis or evidence of dissection. Skeleton: Degenerative changes of the cervical spine, greatest at C4-C5 and C5-C6. Other neck: No mass or adenopathy. Upper chest: No apical lung mass. Review of the MIP images confirms the above findings CTA HEAD FINDINGS Anterior circulation: Intracranial internal carotid arteries are patent. Anterior and middle cerebral arteries are patent. Posterior circulation: Intracranial vertebral arteries, basilar artery, and posterior cerebral arteries are patent Venous sinuses: As permitted by contrast timing, patent. Review of the MIP images confirms the above findings IMPRESSION: No large vessel occlusion, hemodynamically significant stenosis, or evidence of dissection. Electronically Signed   By: Macy Mis M.D.   On: 09/23/2019 17:40   CT Code Stroke CTA Neck W/WO contrast  Result Date: 09/23/2019 CLINICAL DATA:  Left-sided weakness, slurred  speech EXAM: CT ANGIOGRAPHY HEAD AND NECK TECHNIQUE: Multidetector CT imaging of the head and neck was performed using the standard protocol during bolus administration of intravenous contrast. Multiplanar CT image reconstructions and MIPs were obtained to evaluate the vascular anatomy. Carotid stenosis measurements (when applicable) are obtained utilizing NASCET criteria, using the distal internal carotid diameter as the denominator. CONTRAST:  54mL OMNIPAQUE IOHEXOL 350 MG/ML SOLN COMPARISON:  None. FINDINGS: CTA NECK FINDINGS Aortic arch: Great vessel origins are patent. Right carotid system:  Patent. No measurable stenosis or evidence of dissection. Left carotid system: Patent. No measurable stenosis or evidence of dissection. Vertebral arteries: Patent and nearly codominant. No measurable stenosis or evidence of dissection. Skeleton: Degenerative changes of the cervical spine, greatest at C4-C5 and C5-C6. Other neck: No mass or adenopathy. Upper chest: No apical lung mass. Review of the MIP images confirms the above findings CTA HEAD FINDINGS Anterior circulation: Intracranial internal carotid arteries are patent. Anterior and middle cerebral arteries are patent. Posterior circulation: Intracranial vertebral arteries, basilar artery, and posterior cerebral arteries are patent Venous sinuses: As permitted by contrast timing, patent. Review of the MIP images confirms the above findings IMPRESSION: No large vessel occlusion, hemodynamically significant stenosis, or evidence of dissection. Electronically Signed   By: Macy Mis M.D.   On: 09/23/2019 17:40   MR BRAIN WO CONTRAST  Result Date: 09/24/2019 CLINICAL DATA:  Left-sided facial droop EXAM: MRI HEAD WITHOUT CONTRAST TECHNIQUE: Multiplanar, multiecho pulse sequences of the brain and surrounding structures were obtained without intravenous contrast. COMPARISON:  None. FINDINGS: BRAIN: No acute infarct, acute hemorrhage or extra-axial collection. Normal white matter signal for age. Normal volume of brain parenchyma and CSF spaces. Midline structures are normal. VASCULAR: Major flow voids are preserved. Susceptibility-sensitive sequences show no chronic microhemorrhage or superficial siderosis. SKULL AND UPPER CERVICAL SPINE: Normal calvarium and skull base. Visualized upper cervical spine and soft tissues are normal. SINUSES/ORBITS: Sphenoid sinus mucosal thickening. Small amount of right mastoid fluid. Normal orbits. IMPRESSION: Normal brain MRI. Electronically Signed   By: Ulyses Jarred M.D.   On: 09/24/2019 00:18   CT HEAD CODE STROKE WO  CONTRAST  Result Date: 09/23/2019 CLINICAL DATA:  Code stroke.  Left-sided weakness, slurred speech EXAM: CT HEAD WITHOUT CONTRAST TECHNIQUE: Contiguous axial images were obtained from the base of the skull through the vertex without intravenous contrast. COMPARISON:  None. FINDINGS: Brain: There is no acute intracranial hemorrhage, mass effect, or edema. Normal gray-white differentiation is preserved. Ventricles and sulci are normal in size and configuration there is no extra-axial fluid collection Vascular: No hyperdense vessel. Skull: Unremarkable. Sinuses/Orbits: Diffuse mucosal thickening with layering fluid in the left maxillary sinus. There is evidence of prior sinonasal surgery. Orbits are unremarkable. Other: Minimal right mastoid tip opacification. ASPECTS ([place] Stroke Program Early CT Score) - Ganglionic level infarction (caudate, lentiform nuclei, internal capsule, insula, M1-M3 cortex): 7 - Supraganglionic infarction (M4-M6 cortex): 3 Total score (0-10 with 10 being normal): 10 IMPRESSION: No acute intracranial hemorrhage or evidence of acute infarction. ASPECT score is 10. These results were communicated to Dr. Cheral Marker at 5:15 pmon 4/4/2021by text page via the [facility] messaging system. Electronically Signed   By: Macy Mis M.D.   On: 09/23/2019 17:18    PHYSICAL EXAM  Temp:  [97.6 F (36.4 C)-98 F (36.7 C)] 97.6 F (36.4 C) (04/05 0847) Pulse Rate:  [69-88] 88 (04/05 0847) Resp:  [14-19] 19 (04/05 0847) BP: (143-165)/(85-92) 150/85 (04/05 0847) SpO2:  [94 %-98 %] 97 % (04/05  [id number]) Weight:  [73.9 kg-76 kg] 73.9 kg (04/05 0541)  General - Well nourished, well developed, in no apparent distress.  Ophthalmologic - fundi not visualized due to noncooperation.  Cardiovascular - Regular rhythm and rate.  Mental Status -  Level of arousal and orientation to time, place, and person were intact. Language including expression, naming, repetition, comprehension was assessed and found  intact. Fund of Knowledge was assessed and was intact.  Cranial Nerves II - XII - II - Visual field intact OU. III, IV, VI - Extraocular movements intact. V - Facial sensation intact bilaterally. VII - Facial movement intact bilaterally. VIII - Hearing & vestibular intact bilaterally. X - Palate elevates symmetrically. XI - Chin turning & shoulder shrug intact bilaterally. XII - Tongue protrusion intact.  Motor Strength - The patient's strength was normal in all extremities and pronator drift was absent.  Bulk was normal and fasciculations were absent.   Motor Tone - Muscle tone was assessed at the neck and appendages and was normal.  Reflexes - The patient's reflexes were symmetrical in all extremities and she had no pathological reflexes.  Sensory - Light touch, temperature/pinprick were assessed and were symmetrical.    Coordination - The patient had normal movements in the hands and feet with no ataxia or dysmetria.  Tremor was absent.  Gait and Station - deferred.   ASSESSMENT/PLAN [name] is a 64 y.o. female with history of alcohol abuse presenting with L facial droop and dysarthria.  Denies headache, denies history of migraine.  Denies smoking.  Heavy alcohol in the past.  R brain TIA    Code Stroke CT head No acute abnormality. ASPECTS 10.     CTA head & neck no LVO or significant atherosclerosis or stenosis   MRI  Unremarkable   2D Echo EF 55-60%. No source of embolus   LDL 185  HgbA1c 5.6  Lovenox 40 mg sq daily for VTE prophylaxis  No antithrombotic prior to admission, now on aspirin 81 mg daily and clopidogrel 75 mg daily. Continue DAPT x 3 weeks then aspirin alone    Therapy recommendations:  No therapy needs  Disposition:  Return home  Blood Pressure  Home meds:  None, no hx HTN . Slightly elevated 140s . BP goal normotensive  Hyperlipidemia  Home meds:  No statin  Now on lipitor 80  LDL 185, goal < 70  Continue statin at  discharge   Other Stroke Risk Factors  Hx ETOH use  Family hx stroke (mother)  Hospital day # 1  Neurology will sign off. Please call with questions. Pt will follow up with stroke clinic NP at [facility] in about 4 weeks. Thanks for the consult.  Rosalin Hawking, MD PhD Stroke Neurology 09/24/2019 3:08 PM    To contact Stroke Continuity provider, please refer to [email or web address] After hours, contact General Neurology

## 2019-09-24 NOTE — Progress Notes (Signed)
Patient was stable at discharge. I removed her IV. We reviewed the discharge education. Patient/Family verbalized understanding and had no further questions. Patient left with belongings in hand.  

## 2019-09-24 NOTE — Progress Notes (Signed)
OT Cancellation Note  Patient Details Name: Nichole Gill MRN: [id number] DOB: January 29, 1956   Cancelled Treatment:    Reason Eval/Treat Not Completed: OT screened, no needs identified, will sign off  Malka So 09/24/2019, 9:12 AM  Nestor Lewandowsky, OTR/L Acute Rehabilitation Services Pager: [phone] Office: [phone]

## 2019-09-24 NOTE — Progress Notes (Signed)
SLP Cancellation Note  Patient Details Name: TALONA THRESHER MRN: [id number] DOB: [birth year]   Cancelled treatment:       Reason Eval/Treat Not Completed: Other (comment) Order received for swallow evaluation but per chart pt has passed the [facility] swallow screen and is on a diet. Per MD there are no concerns about her current diet. SLP will defer swallow evaluation at this time. Please reorder with any acute concerns.     Osie Bond., M.A. [place] Acute Rehabilitation Services Pager [phone] Office [phone]  09/24/2019, 12:29 PM

## 2019-09-24 NOTE — ED Provider Notes (Signed)
[facility] EMERGENCY DEPARTMENT Provider Note   CSN: [id number] Arrival date & time: 09/23/19  1654  An emergency department physician performed an initial assessment on this suspected stroke patient at 60.  History Chief Complaint  Patient presents with  . Code Stroke    Nichole Gill is a 64 y.o. female.  HPI    64 year old female with no significant medical history comes in a chief complaint of sudden onset slurred speech and facial droop. According to patient's husband, patient had sudden onset of slurred speech with last known normal at 4 PM.  She also had left-sided facial droop.  In route patient's symptoms have improved.  She has no history of strokes.  She denies any headaches, trauma.  Code stroke activated.  Past Medical History:  Diagnosis Date  . Fibroid 2007   3 cm  . Lactose intolerance   . Substance abuse ([place]) 2010   alcoholism    Patient Active Problem List   Diagnosis Date Noted  . TIA (transient ischemic attack) 09/23/2019  . Nasal congestion with rhinorrhea 12/25/2018    Past Surgical History:  Procedure Laterality Date  . ADENOIDECTOMY    . CESAREAN SECTION    . CYST EXCISION Right 06/08/2019   Procedure: EXCISION CYST DEBRIDEMENT DISTAL INTERPHALANGEAL RIGHT MIDDLE FINGER;  Surgeon: Daryll Brod, MD;  Location: [facility];  Service: Orthopedics;  Laterality: Right;  FAB ANESTHESIA  . ENDOMETRIAL BIOPSY  05-25-06   -benign (Dr. Joan Flores)  . IRRIGATION AND DEBRIDEMENT ABSCESS Left 04/27/2013   Procedure: Minor DEBRIDEMENT DISTAL INTERPHALANGEAL JOINT, EXCISION MUCOID CYSTS LEFT LONG FINGER AND LEFT RING FINGER (MINOR PROCEDURE) ;  Surgeon: Cammie Sickle., MD;  Location: [facility];  Service: Orthopedics;  Laterality: Left;  PENROSE DRAIN USED FOR TOURNIQUET--ON LEFT RING AT 1309, OFF AT 1323. ON LEFT LONG FINGER AT 1325, OFF AT 1335.   Marland Kitchen NASAL SINUS SURGERY  2010  . TONSILLECTOMY       OB  History    Gravida  3   Para  3   Term  3   Preterm      AB      Living  3     SAB      TAB      Ectopic      Multiple      Live Births              Family History  Problem Relation Age of Onset  . Osteoporosis Mother   . Stroke Mother   . Heart Problems Mother   . Allergic rhinitis Daughter   . Asthma Daughter   . Heart Problems Father   . Angioedema Neg Hx   . Atopy Neg Hx   . Urticaria Neg Hx   . Eczema Neg Hx     Social History   Tobacco Use  . Smoking status: Never Smoker  . Smokeless tobacco: Never Used  Substance Use Topics  . Alcohol use: No  . Drug use: No    Home Medications Prior to Admission medications   Medication Sig Start Date End Date Taking? Authorizing Provider  loratadine (ALAVERT) 10 MG dissolvable tablet Take 10 mg by mouth daily.   Yes [provider]  Melatonin 10 MG TABS Take 10 mg by mouth at bedtime.    Yes [provider]  VITAMIN D PO Take 1 tablet by mouth daily.   Yes [provider]  traMADol Veatrice Bourbon) 50  MG tablet Take 1 tablet (50 mg total) by mouth every 6 (six) hours as needed. Patient not taking: Reported on 09/23/2019 06/08/19   Daryll Brod, MD    Allergies    Patient has no known allergies.  Review of Systems   Review of Systems  Constitutional: Positive for activity change.  Respiratory: Negative for shortness of breath.   Cardiovascular: Negative for chest pain.  Gastrointestinal: Negative for nausea and vomiting.  Neurological: Negative for headaches.  All other systems reviewed and are negative.   Physical Exam Updated Vital Signs BP (!) 148/92 (BP Location: Left Arm)   Pulse 71   Temp 97.8 F (36.6 C) (Oral)   Resp 17   Ht 5\' 6"  (1.676 m)   Wt 76 kg   SpO2 94%   BMI 27.04 kg/m   Physical Exam Vitals and nursing note reviewed.  Constitutional:      Appearance: She is well-developed.  HENT:     Head: Normocephalic and atraumatic.  Cardiovascular:     Rate  and Rhythm: Normal rate.  Pulmonary:     Effort: Pulmonary effort is normal.  Abdominal:     General: Bowel sounds are normal.  Musculoskeletal:     Cervical back: Normal range of motion and neck supple.  Skin:    General: Skin is warm and dry.  Neurological:     Mental Status: She is alert and oriented to person, place, and time.     Cranial Nerves: Cranial nerve deficit present.     Sensory: No sensory deficit.     Motor: No weakness.     Coordination: Coordination normal.     Gait: Gait normal.     Deep Tendon Reflexes: Reflexes normal.     Comments: Subtle facial droop on the left side.      ED Results / Procedures / Treatments   Labs (all labs ordered are listed, but only abnormal results are displayed) Labs Reviewed  CBC - Abnormal; Notable for the following components:      Result Value   RBC 5.12 (*)    Hemoglobin 16.5 (*)    HCT 49.1 (*)    All other components within normal limits  COMPREHENSIVE METABOLIC PANEL - Abnormal; Notable for the following components:   Glucose, Bld 116 (*)    All other components within normal limits  CK - Abnormal; Notable for the following components:   Total CK <5 (*)    All other components within normal limits  I-STAT CHEM 8, ED - Abnormal; Notable for the following components:   Glucose, Bld 118 (*)    Calcium, Ion 1.14 (*)    Hemoglobin 17.0 (*)    HCT 50.0 (*)    All other components within normal limits  CBG MONITORING, ED - Abnormal; Notable for the following components:   Glucose-Capillary 111 (*)    All other components within normal limits  SARS CORONAVIRUS 2 (TAT 6-24 HRS)  PROTIME-INR  APTT  DIFFERENTIAL  HIV ANTIBODY (ROUTINE TESTING W REFLEX)  HEMOGLOBIN A1C  LIPID PANEL  CBC    EKG EKG Interpretation  Date/Time:  Sunday September 23 2019 17:42:18 EDT Ventricular Rate:  81 PR Interval:    QRS Duration: 100 QT Interval:  401 QTC Calculation: 466 R Axis:   -40 Text Interpretation: Sinus rhythm Left axis  deviation RSR' in V1 or V2, probably normal variant No old tracing to compare Confirmed by Delora Fuel ([id number]) on 09/23/2019 11:37:04 PM   Radiology CT Code  Stroke CTA Head W/WO contrast  Result Date: 09/23/2019 CLINICAL DATA:  Left-sided weakness, slurred speech EXAM: CT ANGIOGRAPHY HEAD AND NECK TECHNIQUE: Multidetector CT imaging of the head and neck was performed using the standard protocol during bolus administration of intravenous contrast. Multiplanar CT image reconstructions and MIPs were obtained to evaluate the vascular anatomy. Carotid stenosis measurements (when applicable) are obtained utilizing NASCET criteria, using the distal internal carotid diameter as the denominator. CONTRAST:  58mL OMNIPAQUE IOHEXOL 350 MG/ML SOLN COMPARISON:  None. FINDINGS: CTA NECK FINDINGS Aortic arch: Great vessel origins are patent. Right carotid system: Patent. No measurable stenosis or evidence of dissection. Left carotid system: Patent. No measurable stenosis or evidence of dissection. Vertebral arteries: Patent and nearly codominant. No measurable stenosis or evidence of dissection. Skeleton: Degenerative changes of the cervical spine, greatest at C4-C5 and C5-C6. Other neck: No mass or adenopathy. Upper chest: No apical lung mass. Review of the MIP images confirms the above findings CTA HEAD FINDINGS Anterior circulation: Intracranial internal carotid arteries are patent. Anterior and middle cerebral arteries are patent. Posterior circulation: Intracranial vertebral arteries, basilar artery, and posterior cerebral arteries are patent Venous sinuses: As permitted by contrast timing, patent. Review of the MIP images confirms the above findings IMPRESSION: No large vessel occlusion, hemodynamically significant stenosis, or evidence of dissection. Electronically Signed   By: Macy Mis M.D.   On: 09/23/2019 17:40   CT Code Stroke CTA Neck W/WO contrast  Result Date: 09/23/2019 CLINICAL DATA:  Left-sided  weakness, slurred speech EXAM: CT ANGIOGRAPHY HEAD AND NECK TECHNIQUE: Multidetector CT imaging of the head and neck was performed using the standard protocol during bolus administration of intravenous contrast. Multiplanar CT image reconstructions and MIPs were obtained to evaluate the vascular anatomy. Carotid stenosis measurements (when applicable) are obtained utilizing NASCET criteria, using the distal internal carotid diameter as the denominator. CONTRAST:  41mL OMNIPAQUE IOHEXOL 350 MG/ML SOLN COMPARISON:  None. FINDINGS: CTA NECK FINDINGS Aortic arch: Great vessel origins are patent. Right carotid system: Patent. No measurable stenosis or evidence of dissection. Left carotid system: Patent. No measurable stenosis or evidence of dissection. Vertebral arteries: Patent and nearly codominant. No measurable stenosis or evidence of dissection. Skeleton: Degenerative changes of the cervical spine, greatest at C4-C5 and C5-C6. Other neck: No mass or adenopathy. Upper chest: No apical lung mass. Review of the MIP images confirms the above findings CTA HEAD FINDINGS Anterior circulation: Intracranial internal carotid arteries are patent. Anterior and middle cerebral arteries are patent. Posterior circulation: Intracranial vertebral arteries, basilar artery, and posterior cerebral arteries are patent Venous sinuses: As permitted by contrast timing, patent. Review of the MIP images confirms the above findings IMPRESSION: No large vessel occlusion, hemodynamically significant stenosis, or evidence of dissection. Electronically Signed   By: Macy Mis M.D.   On: 09/23/2019 17:40   MR BRAIN WO CONTRAST  Result Date: 09/24/2019 CLINICAL DATA:  Left-sided facial droop EXAM: MRI HEAD WITHOUT CONTRAST TECHNIQUE: Multiplanar, multiecho pulse sequences of the brain and surrounding structures were obtained without intravenous contrast. COMPARISON:  None. FINDINGS: BRAIN: No acute infarct, acute hemorrhage or extra-axial  collection. Normal white matter signal for age. Normal volume of brain parenchyma and CSF spaces. Midline structures are normal. VASCULAR: Major flow voids are preserved. Susceptibility-sensitive sequences show no chronic microhemorrhage or superficial siderosis. SKULL AND UPPER CERVICAL SPINE: Normal calvarium and skull base. Visualized upper cervical spine and soft tissues are normal. SINUSES/ORBITS: Sphenoid sinus mucosal thickening. Small amount of right mastoid fluid. Normal orbits. IMPRESSION:  Normal brain MRI. Electronically Signed   By: Ulyses Jarred M.D.   On: 09/24/2019 00:18   CT HEAD CODE STROKE WO CONTRAST  Result Date: 09/23/2019 CLINICAL DATA:  Code stroke.  Left-sided weakness, slurred speech EXAM: CT HEAD WITHOUT CONTRAST TECHNIQUE: Contiguous axial images were obtained from the base of the skull through the vertex without intravenous contrast. COMPARISON:  None. FINDINGS: Brain: There is no acute intracranial hemorrhage, mass effect, or edema. Normal gray-white differentiation is preserved. Ventricles and sulci are normal in size and configuration there is no extra-axial fluid collection Vascular: No hyperdense vessel. Skull: Unremarkable. Sinuses/Orbits: Diffuse mucosal thickening with layering fluid in the left maxillary sinus. There is evidence of prior sinonasal surgery. Orbits are unremarkable. Other: Minimal right mastoid tip opacification. ASPECTS ([place] Stroke Program Early CT Score) - Ganglionic level infarction (caudate, lentiform nuclei, internal capsule, insula, M1-M3 cortex): 7 - Supraganglionic infarction (M4-M6 cortex): 3 Total score (0-10 with 10 being normal): 10 IMPRESSION: No acute intracranial hemorrhage or evidence of acute infarction. ASPECT score is 10. These results were communicated to Dr. Cheral Marker at 5:15 pmon 4/4/2021by text page via the [facility] messaging system. Electronically Signed   By: Macy Mis M.D.   On: 09/23/2019 17:18    Procedures Procedures  (including critical care time)  Medications Ordered in ED Medications   stroke: mapping our early stages of recovery book (has no administration in time range)  acetaminophen (TYLENOL) tablet 650 mg (has no administration in time range)    Or  acetaminophen (TYLENOL) 160 MG/5ML solution 650 mg (has no administration in time range)    Or  acetaminophen (TYLENOL) suppository 650 mg (has no administration in time range)  enoxaparin (LOVENOX) injection 40 mg (has no administration in time range)  aspirin EC tablet 81 mg (has no administration in time range)  sodium chloride flush (NS) 0.9 % injection 3 mL (3 mLs Intravenous Given 09/23/19 1805)  iohexol (OMNIPAQUE) 350 MG/ML injection 75 mL (75 mLs Intravenous Contrast Given 09/23/19 1728)  aspirin EC tablet 650 mg (650 mg Oral Given 09/23/19 1804)    ED Course  I have reviewed the triage vital signs and the nursing notes.  Pertinent labs & imaging results that were available during my care of the patient were reviewed by me and considered in my medical decision making (see chart for details).    MDM Rules/Calculators/A&P                      64 year old female comes in a chief complaint of slurred speech and facial droop.  Her symptoms have evolved and improved since the onset.  Differential diagnoses include stroke, TIA, metabolic encephalopathy, MS.   She was last known normal at 4 PM and is within the TPA window therefore code stroke was activated.  Neuro team has seen the patient and recommend admission with further stroke work-up.  She is not a TPA candidate given that her symptoms have improved and are not debilitating at this time.  The patient appears reasonably screened and/or stabilized for discharge and I doubt any other medical condition or other [facility] requiring further screening, evaluation, or treatment in the ED at this time prior to discharge.   Results from the ER workup discussed with the patient face to face and all questions  answered to the best of my ability. The patient is safe for discharge with strict return precautions.  Final Clinical Impression(s) / ED Diagnoses Final diagnoses:  Slurred speech  Facial  droop    Rx / DC Orders ED Discharge Orders    None       Varney Biles, MD 09/24/19 [phone]

## 2019-10-01 DIAGNOSIS — Z713 Dietary counseling and surveillance: Secondary | ICD-10-CM | POA: Diagnosis not present

## 2019-10-09 DIAGNOSIS — E7800 Pure hypercholesterolemia, unspecified: Secondary | ICD-10-CM | POA: Diagnosis not present

## 2019-10-09 DIAGNOSIS — G459 Transient cerebral ischemic attack, unspecified: Secondary | ICD-10-CM | POA: Diagnosis not present

## 2019-10-22 DIAGNOSIS — Z713 Dietary counseling and surveillance: Secondary | ICD-10-CM | POA: Diagnosis not present

## 2019-10-24 ENCOUNTER — Inpatient Hospital Stay: Payer: Federal, State, Local not specified - PPO | Admitting: Adult Health

## 2019-10-25 ENCOUNTER — Ambulatory Visit (INDEPENDENT_AMBULATORY_CARE_PROVIDER_SITE_OTHER): Payer: Federal, State, Local not specified - PPO | Admitting: Adult Health

## 2019-10-25 ENCOUNTER — Other Ambulatory Visit: Payer: Self-pay

## 2019-10-25 ENCOUNTER — Encounter: Payer: Self-pay | Admitting: Adult Health

## 2019-10-25 VITALS — BP 132/69 | HR 57 | Temp 97.0°F | Ht 66.0 in | Wt 158.0 lb

## 2019-10-25 DIAGNOSIS — G459 Transient cerebral ischemic attack, unspecified: Secondary | ICD-10-CM

## 2019-10-25 DIAGNOSIS — E785 Hyperlipidemia, unspecified: Secondary | ICD-10-CM | POA: Diagnosis not present

## 2019-10-25 NOTE — Patient Instructions (Signed)
Continue aspirin 81 mg daily  and atorvastatin 80 mg daily for secondary stroke prevention  Continue to follow up with PCP regarding cholesterol management with repeat cholesterol levels in July  Maintain strict control of hypertension with blood pressure goal below 130/90, diabetes with hemoglobin A1c goal below 6.5% and cholesterol with LDL cholesterol (bad cholesterol) goal below 70 mg/dL. I also advised the patient to eat a healthy diet with plenty of whole grains, cereals, fruits and vegetables, exercise regularly and maintain ideal body weight.  Followup in the future with me in 6 months or call earlier if needed       Thank you for coming to see [place] at [facility]. I hope we have been able to provide you high quality care today.  You may receive a patient satisfaction survey over the next few weeks. We would appreciate your feedback and comments so that we may continue to improve ourselves and the health of our patients.

## 2019-10-25 NOTE — Progress Notes (Signed)
[facility] [address] [place]. [place] [address] [phone]       HOSPITAL FOLLOW UP NOTE  [name] Date of Birth:  02/20/1956 Medical Record Number:  [id number]   Reason for Referral:  hospital stroke follow up    SUBJECTIVE:   CHIEF COMPLAINT:  Chief Complaint  Patient presents with  . Transient Ischemic Attack    hospital f/u - doing well no concerns    HPI:   [name] is a 64 y.o. female with history of alcohol abuse  who presented on 09/23/2019 with L facial droop and dysarthria.   Stroke work-up unremarkable and likely right brain TIA. Denied headache, denies history of migraine.  Denied smoking.  Heavy alcohol in the past.  Recommended DAPT for 3 weeks and aspirin alone.  No prior history of HTN with slightly elevated BPs and BP goal normotensive range.  LDL 185 and recommended atorvastatin 80 mg daily.  No history of DM.  Other stroke risk factors include history of EtOH use and family history of stroke but no personal history of stroke.  Discharged home in stable condition without therapy needs.   R brain TIA    Code Stroke CT head No acute abnormality. ASPECTS 10.     CTA head & neck no LVO or significant atherosclerosis or stenosis   MRI  Unremarkable   2D Echo EF 55-60%. No source of embolus   LDL 185 -atorvastatin 80 mg daily  HgbA1c 5.6  Lovenox 40 mg sq daily for VTE prophylaxis  No antithrombotic prior to admission, now on aspirin 81 mg daily and clopidogrel 75 mg daily. Continue DAPT x 3 weeks then aspirin alone    Therapy recommendations:  No therapy needs  Disposition:  Return home  Today, 10/25/2019, Nichole Gill is being seen for hospital follow-up.  She has been stable since discharge approximately 4 weeks ago without new or reoccurring stroke/TIA symptoms.  Completed 3 weeks DAPT and continues on aspirin alone without bleeding or bruising.  Continues on atorvastatin 80 mg daily without myalgias.   Blood pressure today satisfactory 132/69. Has follow up with PCP in July and plans on having lab work obtained at that time. No concerns at this time.      ROS:   14 system review of systems performed and negative with exception of no complaints  PMH:  Past Medical History:  Diagnosis Date  . Fibroid 2007   3 cm  . Lactose intolerance   . Substance abuse ([place]) 2010   alcoholism    PSH:  Past Surgical History:  Procedure Laterality Date  . ADENOIDECTOMY    . CESAREAN SECTION    . CYST EXCISION Right 06/08/2019   Procedure: EXCISION CYST DEBRIDEMENT DISTAL INTERPHALANGEAL RIGHT MIDDLE FINGER;  Surgeon: Daryll Brod, MD;  Location: [facility];  Service: Orthopedics;  Laterality: Right;  FAB ANESTHESIA  . ENDOMETRIAL BIOPSY  05-25-06   -benign (Dr. Joan Flores)  . IRRIGATION AND DEBRIDEMENT ABSCESS Left 04/27/2013   Procedure: Minor DEBRIDEMENT DISTAL INTERPHALANGEAL JOINT, EXCISION MUCOID CYSTS LEFT LONG FINGER AND LEFT RING FINGER (MINOR PROCEDURE) ;  Surgeon: Cammie Sickle., MD;  Location: [facility];  Service: Orthopedics;  Laterality: Left;  PENROSE DRAIN USED FOR TOURNIQUET--ON LEFT RING AT 1309, OFF AT 1323. ON LEFT LONG FINGER AT 1325, OFF AT 1335.   Marland Kitchen NASAL SINUS SURGERY  2010  . TONSILLECTOMY      Social History:  Social History  Socioeconomic History  . Marital status: Married    Spouse name: Not on file  . Number of children: Not on file  . Years of education: Not on file  . Highest education level: Not on file  Occupational History  . Not on file  Tobacco Use  . Smoking status: Never Smoker  . Smokeless tobacco: Never Used  Substance and Sexual Activity  . Alcohol use: No  . Drug use: No  . Sexual activity: Yes    Partners: Male    Birth control/protection: I.U.D.    Comment: Mirena inserted 03/2011  Other Topics Concern  . Not on file  Social History Narrative  . Not on file   Social Determinants of Health   Financial  Resource Strain:   . Difficulty of Paying Living Expenses:   Food Insecurity:   . Worried About Charity fundraiser in the Last Year:   . Arboriculturist in the Last Year:   Transportation Needs:   . Film/video editor (Medical):   Marland Kitchen Lack of Transportation (Non-Medical):   Physical Activity:   . Days of Exercise per Week:   . Minutes of Exercise per Session:   Stress:   . Feeling of Stress :   Social Connections:   . Frequency of Communication with Friends and Family:   . Frequency of Social Gatherings with Friends and Family:   . Attends Religious Services:   . Active Member of Clubs or Organizations:   . Attends Archivist Meetings:   Marland Kitchen Marital Status:   Intimate Partner Violence:   . Fear of Current or Ex-Partner:   . Emotionally Abused:   Marland Kitchen Physically Abused:   . Sexually Abused:     Family History:  Family History  Problem Relation Age of Onset  . Osteoporosis Mother   . Stroke Mother   . Heart Problems Mother   . Allergic rhinitis Daughter   . Asthma Daughter   . Heart Problems Father   . Angioedema Neg Hx   . Atopy Neg Hx   . Urticaria Neg Hx   . Eczema Neg Hx     Medications:   Current Outpatient Medications on File Prior to Visit  Medication Sig Dispense Refill  . aspirin EC 81 MG EC tablet Take 1 tablet (81 mg total) by mouth daily. 90 tablet 0  . atorvastatin (LIPITOR) 80 MG tablet Take 1 tablet (80 mg total) by mouth daily at 6 PM. 30 tablet 0  . loratadine (ALAVERT) 10 MG dissolvable tablet Take 10 mg by mouth daily.    . Melatonin 10 MG TABS Take 10 mg by mouth at bedtime.     Marland Kitchen VITAMIN D PO Take 1 tablet by mouth daily.     No current facility-administered medications on file prior to visit.    Allergies:  No Known Allergies    OBJECTIVE:  Physical Exam  Vitals:   10/25/19 0805  BP: 132/69  Pulse: (!) 57  Temp: (!) 97 F (36.1 C)  Weight: 158 lb (71.7 kg)  Height: 5\' 6"  (1.676 m)   Body mass index is 25.5 kg/m. No  exam data present  Depression screen [facility] - Eat 2/9 10/25/2019  Decreased Interest 0  Down, Depressed, Hopeless 0  PHQ - 2 Score 0     General: well developed, well nourished,  pleasant middle-age Caucasian female, seated, in no evident distress Head: head normocephalic and atraumatic.   Neck: supple with no carotid or supraclavicular bruits Cardiovascular:  regular rate and rhythm, no murmurs Musculoskeletal: no deformity Skin:  no rash/petichiae Vascular:  Normal pulses all extremities   Neurologic Exam Mental Status: Awake and fully alert.   Fluent speech and language.  Oriented to place and time. Recent and remote memory intact. Attention span, concentration and fund of knowledge appropriate. Mood and affect appropriate.  Cranial Nerves: Fundoscopic exam reveals sharp disc margins. Pupils equal, briskly reactive to light. Extraocular movements full without nystagmus. Visual fields full to confrontation. Hearing intact. Facial sensation intact. Face, tongue, palate moves normally and symmetrically.  Motor: Normal bulk and tone. Normal strength in all tested extremity muscles. Sensory.: intact to touch , pinprick , position and vibratory sensation.  Coordination: Rapid alternating movements normal in all extremities. Finger-to-nose and heel-to-shin performed accurately bilaterally. Gait and Station: Arises from chair without difficulty. Stance is normal. Gait demonstrates normal stride length and balance Reflexes: 1+ and symmetric. Toes downgoing.     NIHSS  0 Modified Rankin  0      ASSESSMENT: Nichole Gill is a 64 y.o. year old female presented with left facial droop and dysarthria on 09/23/2019 due to right brain TIA. Vascular risk factors include HLD and history of heavy alcohol use.  Stable since discharge without new or reoccurring stroke/TIA symptoms.     PLAN:  1. TIA: Continue aspirin 81 mg daily  and atorvastatin 80 mg daily for secondary stroke prevention. Maintain  strict control of hypertension with blood pressure goal below 130/90, diabetes with hemoglobin A1c goal below 6.5% and cholesterol with LDL cholesterol (bad cholesterol) goal below 70 mg/dL.  I also advised the patient to eat a healthy diet with plenty of whole grains, cereals, fruits and vegetables, exercise regularly with at least 30 minutes of continuous activity daily and maintain ideal body weight. 2. HLD: Continuation of atorvastatin 80 mg daily and continue to follow with PCP for prescribing, monitoring and management with repeat lipid panel at follow-up visit in July     Follow up in 6 months or call earlier if needed   I spent 40 minutes of face-to-face and non-face-to-face time with patient.  This included previsit chart review, lab review, study review, order entry, electronic health record documentation, patient education regarding recent TIA, importance of managing stroke risk factors and answered all questions to patient satisfaction     Frann Rider, [facility]  [facility] [address] [place], [place] [address]  Phone [phone] Fax [phone] Note: This document was prepared with digital dictation and possible smart phrase technology. Any transcriptional errors that result from this process are unintentional.

## 2019-10-26 NOTE — Progress Notes (Signed)
I agree with the above plan 

## 2019-11-20 DIAGNOSIS — Z713 Dietary counseling and surveillance: Secondary | ICD-10-CM | POA: Diagnosis not present

## 2019-12-11 DIAGNOSIS — Z713 Dietary counseling and surveillance: Secondary | ICD-10-CM | POA: Diagnosis not present

## 2019-12-21 DIAGNOSIS — E7800 Pure hypercholesterolemia, unspecified: Secondary | ICD-10-CM | POA: Diagnosis not present

## 2020-01-02 DIAGNOSIS — Z713 Dietary counseling and surveillance: Secondary | ICD-10-CM | POA: Diagnosis not present

## 2020-01-16 DIAGNOSIS — H35362 Drusen (degenerative) of macula, left eye: Secondary | ICD-10-CM | POA: Diagnosis not present

## 2020-01-17 DIAGNOSIS — Z713 Dietary counseling and surveillance: Secondary | ICD-10-CM | POA: Diagnosis not present

## 2020-02-05 DIAGNOSIS — Z713 Dietary counseling and surveillance: Secondary | ICD-10-CM | POA: Diagnosis not present

## 2020-02-19 DIAGNOSIS — Z713 Dietary counseling and surveillance: Secondary | ICD-10-CM | POA: Diagnosis not present

## 2020-03-05 DIAGNOSIS — Z713 Dietary counseling and surveillance: Secondary | ICD-10-CM | POA: Diagnosis not present

## 2020-03-18 DIAGNOSIS — Z713 Dietary counseling and surveillance: Secondary | ICD-10-CM | POA: Diagnosis not present

## 2020-03-18 DIAGNOSIS — F4320 Adjustment disorder, unspecified: Secondary | ICD-10-CM | POA: Diagnosis not present

## 2020-03-28 DIAGNOSIS — F4320 Adjustment disorder, unspecified: Secondary | ICD-10-CM | POA: Diagnosis not present

## 2020-04-02 DIAGNOSIS — Z713 Dietary counseling and surveillance: Secondary | ICD-10-CM | POA: Diagnosis not present

## 2020-04-15 DIAGNOSIS — Z713 Dietary counseling and surveillance: Secondary | ICD-10-CM | POA: Diagnosis not present

## 2020-04-29 ENCOUNTER — Ambulatory Visit: Payer: Federal, State, Local not specified - PPO | Admitting: Adult Health

## 2020-04-29 DIAGNOSIS — Z713 Dietary counseling and surveillance: Secondary | ICD-10-CM | POA: Diagnosis not present

## 2020-05-20 DIAGNOSIS — Z713 Dietary counseling and surveillance: Secondary | ICD-10-CM | POA: Diagnosis not present

## 2020-05-27 DIAGNOSIS — Z23 Encounter for immunization: Secondary | ICD-10-CM | POA: Diagnosis not present

## 2020-05-27 DIAGNOSIS — E7800 Pure hypercholesterolemia, unspecified: Secondary | ICD-10-CM | POA: Diagnosis not present

## 2020-05-27 DIAGNOSIS — Z0000 Encounter for general adult medical examination without abnormal findings: Secondary | ICD-10-CM | POA: Diagnosis not present

## 2020-06-26 ENCOUNTER — Other Ambulatory Visit: Payer: Self-pay

## 2020-06-26 ENCOUNTER — Ambulatory Visit (INDEPENDENT_AMBULATORY_CARE_PROVIDER_SITE_OTHER): Payer: Federal, State, Local not specified - PPO | Admitting: Psychiatry

## 2020-06-26 ENCOUNTER — Encounter: Payer: Self-pay | Admitting: Psychiatry

## 2020-06-26 DIAGNOSIS — F4323 Adjustment disorder with mixed anxiety and depressed mood: Secondary | ICD-10-CM
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
## 2020-07-01 DIAGNOSIS — D225 Melanocytic nevi of trunk: Secondary | ICD-10-CM | POA: Diagnosis not present

## 2020-07-01 DIAGNOSIS — Z1283 Encounter for screening for malignant neoplasm of skin: Secondary | ICD-10-CM | POA: Diagnosis not present

## 2020-07-01 DIAGNOSIS — L918 Other hypertrophic disorders of the skin: Secondary | ICD-10-CM | POA: Diagnosis not present

## 2020-07-01 DIAGNOSIS — D2271 Melanocytic nevi of right lower limb, including hip: Secondary | ICD-10-CM | POA: Diagnosis not present

## 2020-07-01 DIAGNOSIS — D485 Neoplasm of uncertain behavior of skin: Secondary | ICD-10-CM | POA: Diagnosis not present

## 2020-07-08 ENCOUNTER — Encounter: Payer: Self-pay | Admitting: Psychiatry

## 2020-07-08 ENCOUNTER — Ambulatory Visit (INDEPENDENT_AMBULATORY_CARE_PROVIDER_SITE_OTHER): Payer: Federal, State, Local not specified - PPO | Admitting: Psychiatry

## 2020-07-08 DIAGNOSIS — F4323 Adjustment disorder with mixed anxiety and depressed mood: Secondary | ICD-10-CM | POA: Diagnosis not present
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
## 2020-07-10 ENCOUNTER — Other Ambulatory Visit: Payer: Self-pay

## 2020-07-10 ENCOUNTER — Ambulatory Visit (INDEPENDENT_AMBULATORY_CARE_PROVIDER_SITE_OTHER): Payer: Federal, State, Local not specified - PPO | Admitting: Psychiatry

## 2020-07-10 ENCOUNTER — Encounter: Payer: Self-pay | Admitting: Psychiatry

## 2020-07-10 DIAGNOSIS — F4323 Adjustment disorder with mixed anxiety and depressed mood: Secondary | ICD-10-CM

## 2020-07-10 DIAGNOSIS — Z713 Dietary counseling and surveillance: Secondary | ICD-10-CM | POA: Diagnosis not present
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
## 2020-07-15 DIAGNOSIS — Z124 Encounter for screening for malignant neoplasm of cervix: Secondary | ICD-10-CM | POA: Diagnosis not present

## 2020-07-15 DIAGNOSIS — Z1151 Encounter for screening for human papillomavirus (HPV): Secondary | ICD-10-CM | POA: Diagnosis not present

## 2020-07-15 DIAGNOSIS — Z6826 Body mass index (BMI) 26.0-26.9, adult: Secondary | ICD-10-CM | POA: Diagnosis not present

## 2020-07-15 DIAGNOSIS — Z1389 Encounter for screening for other disorder: Secondary | ICD-10-CM | POA: Diagnosis not present

## 2020-07-15 DIAGNOSIS — Z01419 Encounter for gynecological examination (general) (routine) without abnormal findings: Secondary | ICD-10-CM | POA: Diagnosis not present

## 2020-07-17 DIAGNOSIS — Z20822 Contact with and (suspected) exposure to covid-19: Secondary | ICD-10-CM | POA: Diagnosis not present

## 2020-07-17 DIAGNOSIS — J029 Acute pharyngitis, unspecified: Secondary | ICD-10-CM | POA: Diagnosis not present

## 2020-07-17 DIAGNOSIS — R0981 Nasal congestion: Secondary | ICD-10-CM | POA: Diagnosis not present

## 2020-07-24 ENCOUNTER — Ambulatory Visit (INDEPENDENT_AMBULATORY_CARE_PROVIDER_SITE_OTHER): Payer: Federal, State, Local not specified - PPO | Admitting: Psychologist

## 2020-07-24 DIAGNOSIS — F411 Generalized anxiety disorder: Secondary | ICD-10-CM | POA: Diagnosis not present

## 2020-07-28 DIAGNOSIS — R42 Dizziness and giddiness: Secondary | ICD-10-CM | POA: Diagnosis not present

## 2020-07-28 DIAGNOSIS — R413 Other amnesia: Secondary | ICD-10-CM | POA: Diagnosis not present

## 2020-08-05 DIAGNOSIS — Z1231 Encounter for screening mammogram for malignant neoplasm of breast: Secondary | ICD-10-CM | POA: Diagnosis not present

## 2020-08-07 DIAGNOSIS — Z713 Dietary counseling and surveillance: Secondary | ICD-10-CM | POA: Diagnosis not present

## 2020-08-25 ENCOUNTER — Ambulatory Visit: Payer: Federal, State, Local not specified - PPO | Admitting: Psychiatry

## 2020-09-02 DIAGNOSIS — D485 Neoplasm of uncertain behavior of skin: Secondary | ICD-10-CM | POA: Diagnosis not present

## 2020-09-08 ENCOUNTER — Ambulatory Visit: Payer: Federal, State, Local not specified - PPO | Admitting: Psychiatry

## 2020-09-16 ENCOUNTER — Ambulatory Visit: Payer: Federal, State, Local not specified - PPO | Admitting: Psychologist

## 2020-09-22 ENCOUNTER — Encounter: Payer: Self-pay | Admitting: Psychiatry

## 2020-09-22 ENCOUNTER — Other Ambulatory Visit: Payer: Self-pay

## 2020-09-22 ENCOUNTER — Ambulatory Visit (INDEPENDENT_AMBULATORY_CARE_PROVIDER_SITE_OTHER): Payer: Federal, State, Local not specified - PPO | Admitting: Psychiatry

## 2020-09-22 DIAGNOSIS — F4323 Adjustment disorder with mixed anxiety and depressed mood: Secondary | ICD-10-CM | POA: Diagnosis not present
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
## 2020-10-06 ENCOUNTER — Ambulatory Visit: Payer: Federal, State, Local not specified - PPO | Admitting: Psychiatry

## 2020-10-21 DIAGNOSIS — Z713 Dietary counseling and surveillance: Secondary | ICD-10-CM | POA: Diagnosis not present

## 2020-11-25 DIAGNOSIS — E7800 Pure hypercholesterolemia, unspecified: Secondary | ICD-10-CM | POA: Diagnosis not present

## 2020-12-09 DIAGNOSIS — Z713 Dietary counseling and surveillance: Secondary | ICD-10-CM | POA: Diagnosis not present

## 2021-02-12 DIAGNOSIS — F410 Panic disorder [episodic paroxysmal anxiety] without agoraphobia: Secondary | ICD-10-CM | POA: Diagnosis not present

## 2021-02-26 DIAGNOSIS — Z23 Encounter for immunization: Secondary | ICD-10-CM | POA: Diagnosis not present

## 2021-02-26 DIAGNOSIS — F418 Other specified anxiety disorders: Secondary | ICD-10-CM | POA: Diagnosis not present

## 2021-03-04 DIAGNOSIS — Z86010 Personal history of colonic polyps: Secondary | ICD-10-CM | POA: Diagnosis not present

## 2021-03-15 DIAGNOSIS — H6503 Acute serous otitis media, bilateral: Secondary | ICD-10-CM | POA: Diagnosis not present

## 2021-03-15 DIAGNOSIS — H6123 Impacted cerumen, bilateral: Secondary | ICD-10-CM | POA: Diagnosis not present

## 2021-03-26 DIAGNOSIS — F411 Generalized anxiety disorder: Secondary | ICD-10-CM | POA: Diagnosis not present

## 2021-04-09 DIAGNOSIS — F4322 Adjustment disorder with anxiety: Secondary | ICD-10-CM | POA: Diagnosis not present

## 2021-04-23 DIAGNOSIS — F419 Anxiety disorder, unspecified: Secondary | ICD-10-CM | POA: Diagnosis not present

## 2021-05-04 DIAGNOSIS — R6889 Other general symptoms and signs: Secondary | ICD-10-CM | POA: Diagnosis not present

## 2021-05-04 DIAGNOSIS — J069 Acute upper respiratory infection, unspecified: Secondary | ICD-10-CM | POA: Diagnosis not present

## 2021-05-04 DIAGNOSIS — R051 Acute cough: Secondary | ICD-10-CM | POA: Diagnosis not present

## 2021-05-04 DIAGNOSIS — J101 Influenza due to other identified influenza virus with other respiratory manifestations: Secondary | ICD-10-CM | POA: Diagnosis not present

## 2021-05-04 DIAGNOSIS — Z20822 Contact with and (suspected) exposure to covid-19: Secondary | ICD-10-CM | POA: Diagnosis not present

## 2021-05-07 DIAGNOSIS — F4322 Adjustment disorder with anxiety: Secondary | ICD-10-CM | POA: Diagnosis not present

## 2021-05-31 DIAGNOSIS — J9801 Acute bronchospasm: Secondary | ICD-10-CM | POA: Diagnosis not present

## 2021-06-05 DIAGNOSIS — Z0000 Encounter for general adult medical examination without abnormal findings: Secondary | ICD-10-CM | POA: Diagnosis not present

## 2021-06-05 DIAGNOSIS — E7800 Pure hypercholesterolemia, unspecified: Secondary | ICD-10-CM | POA: Diagnosis not present

## 2021-06-05 DIAGNOSIS — G459 Transient cerebral ischemic attack, unspecified: Secondary | ICD-10-CM | POA: Diagnosis not present

## 2021-06-05 DIAGNOSIS — Z23 Encounter for immunization: Secondary | ICD-10-CM | POA: Diagnosis not present

## 2021-06-05 DIAGNOSIS — J309 Allergic rhinitis, unspecified: Secondary | ICD-10-CM | POA: Diagnosis not present

## 2021-06-05 DIAGNOSIS — F418 Other specified anxiety disorders: Secondary | ICD-10-CM | POA: Diagnosis not present

## 2021-07-07 DIAGNOSIS — Z1283 Encounter for screening for malignant neoplasm of skin: Secondary | ICD-10-CM | POA: Diagnosis not present

## 2021-07-07 DIAGNOSIS — D225 Melanocytic nevi of trunk: Secondary | ICD-10-CM | POA: Diagnosis not present

## 2021-07-07 DIAGNOSIS — L918 Other hypertrophic disorders of the skin: Secondary | ICD-10-CM | POA: Diagnosis not present

## 2021-07-21 DIAGNOSIS — F4322 Adjustment disorder with anxiety: Secondary | ICD-10-CM | POA: Diagnosis not present

## 2021-08-11 DIAGNOSIS — Z01419 Encounter for gynecological examination (general) (routine) without abnormal findings: Secondary | ICD-10-CM | POA: Diagnosis not present

## 2021-08-11 DIAGNOSIS — Z6825 Body mass index (BMI) 25.0-25.9, adult: Secondary | ICD-10-CM | POA: Diagnosis not present

## 2021-08-11 DIAGNOSIS — Z1389 Encounter for screening for other disorder: Secondary | ICD-10-CM | POA: Diagnosis not present

## 2021-08-11 DIAGNOSIS — Z1231 Encounter for screening mammogram for malignant neoplasm of breast: Secondary | ICD-10-CM | POA: Diagnosis not present

## 2021-08-11 DIAGNOSIS — N951 Menopausal and female climacteric states: Secondary | ICD-10-CM | POA: Diagnosis not present

## 2021-08-18 DIAGNOSIS — H6503 Acute serous otitis media, bilateral: Secondary | ICD-10-CM | POA: Diagnosis not present

## 2021-08-18 DIAGNOSIS — F4322 Adjustment disorder with anxiety: Secondary | ICD-10-CM | POA: Diagnosis not present

## 2021-08-18 DIAGNOSIS — J019 Acute sinusitis, unspecified: Secondary | ICD-10-CM | POA: Diagnosis not present

## 2021-09-07 DIAGNOSIS — E7800 Pure hypercholesterolemia, unspecified: Secondary | ICD-10-CM | POA: Diagnosis not present

## 2021-09-22 DIAGNOSIS — F4323 Adjustment disorder with mixed anxiety and depressed mood: Secondary | ICD-10-CM | POA: Diagnosis not present

## 2021-09-29 DIAGNOSIS — H6123 Impacted cerumen, bilateral: Secondary | ICD-10-CM | POA: Diagnosis not present

## 2021-11-12 DIAGNOSIS — F4323 Adjustment disorder with mixed anxiety and depressed mood: Secondary | ICD-10-CM | POA: Diagnosis not present

## 2021-12-10 DIAGNOSIS — F419 Anxiety disorder, unspecified: Secondary | ICD-10-CM | POA: Diagnosis not present

## 2021-12-31 DIAGNOSIS — F419 Anxiety disorder, unspecified: Secondary | ICD-10-CM | POA: Diagnosis not present

## 2022-01-21 DIAGNOSIS — F419 Anxiety disorder, unspecified: Secondary | ICD-10-CM | POA: Diagnosis not present

## 2022-03-03 DIAGNOSIS — L821 Other seborrheic keratosis: Secondary | ICD-10-CM | POA: Diagnosis not present

## 2022-03-03 DIAGNOSIS — R208 Other disturbances of skin sensation: Secondary | ICD-10-CM | POA: Diagnosis not present

## 2022-05-01 DIAGNOSIS — E663 Overweight: Secondary | ICD-10-CM | POA: Diagnosis not present

## 2022-05-01 DIAGNOSIS — E785 Hyperlipidemia, unspecified: Secondary | ICD-10-CM | POA: Diagnosis not present

## 2022-05-01 DIAGNOSIS — R5383 Other fatigue: Secondary | ICD-10-CM | POA: Diagnosis not present

## 2022-05-01 DIAGNOSIS — M2550 Pain in unspecified joint: Secondary | ICD-10-CM | POA: Diagnosis not present

## 2022-05-06 DIAGNOSIS — Z723 Lack of physical exercise: Secondary | ICD-10-CM | POA: Diagnosis not present

## 2022-05-06 DIAGNOSIS — Z724 Inappropriate diet and eating habits: Secondary | ICD-10-CM | POA: Diagnosis not present

## 2022-05-06 DIAGNOSIS — Z713 Dietary counseling and surveillance: Secondary | ICD-10-CM | POA: Diagnosis not present

## 2022-07-01 DIAGNOSIS — F410 Panic disorder [episodic paroxysmal anxiety] without agoraphobia: Secondary | ICD-10-CM | POA: Diagnosis not present

## 2022-07-06 DIAGNOSIS — D485 Neoplasm of uncertain behavior of skin: Secondary | ICD-10-CM | POA: Diagnosis not present

## 2022-07-06 DIAGNOSIS — D2262 Melanocytic nevi of left upper limb, including shoulder: Secondary | ICD-10-CM | POA: Diagnosis not present

## 2022-07-06 DIAGNOSIS — D225 Melanocytic nevi of trunk: Secondary | ICD-10-CM | POA: Diagnosis not present

## 2022-07-06 DIAGNOSIS — Z1283 Encounter for screening for malignant neoplasm of skin: Secondary | ICD-10-CM | POA: Diagnosis not present

## 2022-07-13 DIAGNOSIS — H53143 Visual discomfort, bilateral: Secondary | ICD-10-CM | POA: Diagnosis not present

## 2022-07-13 DIAGNOSIS — H35363 Drusen (degenerative) of macula, bilateral: Secondary | ICD-10-CM | POA: Diagnosis not present

## 2022-08-12 DIAGNOSIS — Z01419 Encounter for gynecological examination (general) (routine) without abnormal findings: Secondary | ICD-10-CM | POA: Diagnosis not present

## 2022-08-12 DIAGNOSIS — Z1231 Encounter for screening mammogram for malignant neoplasm of breast: Secondary | ICD-10-CM | POA: Diagnosis not present

## 2022-08-12 DIAGNOSIS — Z1389 Encounter for screening for other disorder: Secondary | ICD-10-CM | POA: Diagnosis not present

## 2022-08-12 DIAGNOSIS — N951 Menopausal and female climacteric states: Secondary | ICD-10-CM | POA: Diagnosis not present

## 2022-08-12 DIAGNOSIS — N8189 Other female genital prolapse: Secondary | ICD-10-CM | POA: Diagnosis not present

## 2022-08-19 DIAGNOSIS — R413 Other amnesia: Secondary | ICD-10-CM | POA: Diagnosis not present

## 2022-08-19 DIAGNOSIS — E7800 Pure hypercholesterolemia, unspecified: Secondary | ICD-10-CM | POA: Diagnosis not present

## 2022-08-19 DIAGNOSIS — G459 Transient cerebral ischemic attack, unspecified: Secondary | ICD-10-CM | POA: Diagnosis not present

## 2022-08-19 DIAGNOSIS — Z0000 Encounter for general adult medical examination without abnormal findings: Secondary | ICD-10-CM | POA: Diagnosis not present

## 2022-08-20 DIAGNOSIS — Z0000 Encounter for general adult medical examination without abnormal findings: Secondary | ICD-10-CM | POA: Diagnosis not present

## 2022-08-20 DIAGNOSIS — E7800 Pure hypercholesterolemia, unspecified: Secondary | ICD-10-CM | POA: Diagnosis not present

## 2022-08-20 DIAGNOSIS — R7301 Impaired fasting glucose: Secondary | ICD-10-CM | POA: Diagnosis not present

## 2022-10-05 DIAGNOSIS — D485 Neoplasm of uncertain behavior of skin: Secondary | ICD-10-CM | POA: Diagnosis not present

## 2022-10-05 DIAGNOSIS — L820 Inflamed seborrheic keratosis: Secondary | ICD-10-CM | POA: Diagnosis not present

## 2022-11-29 DIAGNOSIS — F418 Other specified anxiety disorders: Secondary | ICD-10-CM | POA: Diagnosis not present

## 2022-12-08 DIAGNOSIS — H52203 Unspecified astigmatism, bilateral: Secondary | ICD-10-CM | POA: Diagnosis not present

## 2022-12-08 DIAGNOSIS — H5203 Hypermetropia, bilateral: Secondary | ICD-10-CM | POA: Diagnosis not present

## 2022-12-08 DIAGNOSIS — H2512 Age-related nuclear cataract, left eye: Secondary | ICD-10-CM | POA: Diagnosis not present

## 2023-02-26 ENCOUNTER — Emergency Department (HOSPITAL_COMMUNITY)
Admission: EM | Admit: 2023-02-26 | Discharge: 2023-02-26 | Disposition: A | Discharge status: Home / Self Care | Payer: Federal, State, Local not specified - PPO | Attending: Emergency Medicine | Admitting: Emergency Medicine

## 2023-02-26 ENCOUNTER — Other Ambulatory Visit: Payer: Self-pay

## 2023-02-26 ENCOUNTER — Emergency Department (HOSPITAL_COMMUNITY): Payer: Federal, State, Local not specified - PPO

## 2023-02-26 DIAGNOSIS — R1031 Right lower quadrant pain: Secondary | ICD-10-CM | POA: Diagnosis not present

## 2023-02-26 DIAGNOSIS — K429 Umbilical hernia without obstruction or gangrene: Secondary | ICD-10-CM | POA: Diagnosis not present

## 2023-02-26 DIAGNOSIS — R109 Unspecified abdominal pain: Secondary | ICD-10-CM | POA: Diagnosis not present

## 2023-02-26 DIAGNOSIS — R935 Abnormal findings on diagnostic imaging of other abdominal regions, including retroperitoneum: Secondary | ICD-10-CM | POA: Diagnosis not present

## 2023-02-26 DIAGNOSIS — Z7982 Long term (current) use of aspirin: Secondary | ICD-10-CM | POA: Insufficient documentation

## 2023-02-26 LAB — URINALYSIS, ROUTINE W REFLEX MICROSCOPIC
Bilirubin Urine: NEGATIVE
Glucose, UA: NEGATIVE mg/dL
Ketones, ur: NEGATIVE mg/dL
Nitrite: NEGATIVE
Protein, ur: NEGATIVE mg/dL
Specific Gravity, Urine: 1.01 (ref 1.005–1.030)
pH: 5 (ref 5.0–8.0)

## 2023-02-26 LAB — COMPREHENSIVE METABOLIC PANEL
ALT: 21 U/L (ref 0–44)
AST: 18 U/L (ref 15–41)
Albumin: 3.9 g/dL (ref 3.5–5.0)
Alkaline Phosphatase: 54 U/L (ref 38–126)
Anion gap: 10 (ref 5–15)
BUN: 16 mg/dL (ref 8–23)
CO2: 27 mmol/L (ref 22–32)
Calcium: 9.5 mg/dL (ref 8.9–10.3)
Chloride: 103 mmol/L (ref 98–111)
Creatinine, Ser: 0.85 mg/dL (ref 0.44–1.00)
GFR, Estimated: >60 mL/min (ref >60)
Glucose, Bld: 115 mg/dL — ABNORMAL HIGH (ref 70–99)
Potassium: 3.8 mmol/L (ref 3.5–5.1)
Sodium: 140 mmol/L (ref 135–145)
Total Bilirubin: 1.2 mg/dL (ref 0.3–1.2)
Total Protein: 6.7 g/dL (ref 6.5–8.1)

## 2023-02-26 LAB — LIPASE, BLOOD: Lipase: 28 U/L (ref 11–51)

## 2023-02-26 LAB — CBC
HCT: 44.5 % (ref 36.0–46.0)
Hemoglobin: 14.9 g/dL (ref 12.0–15.0)
MCH: 31.5 pg (ref 26.0–34.0)
MCHC: 33.5 g/dL (ref 30.0–36.0)
MCV: 94.1 fL (ref 80.0–100.0)
Platelets: 322 10*3/uL (ref 150–400)
RBC: 4.73 MIL/uL (ref 3.87–5.11)
RDW: 12.7 % (ref 11.5–15.5)
WBC: 6.1 10*3/uL (ref 4.0–10.5)
nRBC: 0 % (ref 0.0–0.2)

## 2023-02-26 NOTE — Discharge Instructions (Signed)
Return for any problem.  ?

## 2023-02-26 NOTE — ED Provider Notes (Signed)
[place] EMERGENCY DEPARTMENT AT [facility] Provider Note   CSN: [id number] Arrival date & time: 02/26/23  0630     History  Chief Complaint  Patient presents with   Groin Pain    Nichole Gill is a 67 y.o. female.  67 year old female with prior medical history as detailed below presents for evaluation.  Patient reports gradual onset of right sided low flank and right lower quadrant abdominal pain.  Patient's pain was initially dull and achy.  Over the last several hours it became sharp and intermittent.  She has not taken anything for her pain.  She reports that her pain is now completely resolved.  She denies associated nausea, vomiting, urinary symptoms, bowel movement change, fever, other complaint.  She denies prior history of renal colic.    She reports distant history of C-section but otherwise no abdominal surgeries.      The history is provided by the patient and medical records.       Home Medications Prior to Admission medications   Medication Sig Start Date End Date Taking? Authorizing Provider  aspirin EC 81 MG EC tablet Take 1 tablet (81 mg total) by mouth daily. 09/25/19   Amin, Ankit C, MD  atorvastatin (LIPITOR) 80 MG tablet Take 1 tablet (80 mg total) by mouth daily at 6 PM. 09/24/19   Amin, Ankit C, MD  loratadine (ALAVERT) 10 MG dissolvable tablet Take 10 mg by mouth daily.    [provider]  Melatonin 10 MG TABS Take 10 mg by mouth at bedtime.     [provider]  VITAMIN D PO Take 1 tablet by mouth daily.    [provider]      Allergies    Patient has no known allergies.    Review of Systems   Review of Systems  All other systems reviewed and are negative.   Physical Exam Updated Vital Signs BP (!) 155/96 (BP Location: Right Arm)   Pulse 84   Temp (!) 97.4 F (36.3 C) (Oral)   Resp 16   Ht 5\' 6"  (1.676 m)   Wt 72.6 kg   SpO2 96%   BMI 25.82 kg/m  Physical Exam Vitals and nursing note  reviewed.  Constitutional:      General: She is not in acute distress.    Appearance: Normal appearance. She is well-developed.  HENT:     Head: Normocephalic and atraumatic.  Eyes:     Conjunctiva/sclera: Conjunctivae normal.     Pupils: Pupils are equal, round, and reactive to light.  Cardiovascular:     Rate and Rhythm: Normal rate and regular rhythm.     Heart sounds: Normal heart sounds.  Pulmonary:     Effort: Pulmonary effort is normal. No respiratory distress.     Breath sounds: Normal breath sounds.  Abdominal:     General: There is no distension.     Palpations: Abdomen is soft.     Tenderness: There is no abdominal tenderness.  Musculoskeletal:        General: No deformity. Normal range of motion.     Cervical back: Normal range of motion and neck supple.  Skin:    General: Skin is warm and dry.  Neurological:     General: No focal deficit present.     Mental Status: She is alert and oriented to person, place, and time.     ED Results / Procedures / Treatments   Labs (all labs ordered are listed,  but only abnormal results are displayed) Labs Reviewed  LIPASE, BLOOD  COMPREHENSIVE METABOLIC PANEL  CBC  URINALYSIS, ROUTINE W REFLEX MICROSCOPIC    EKG None  Radiology No results found.  Procedures Procedures    Medications Ordered in ED Medications - No data to display  ED Course/ Medical Decision Making/ A&P                                 Medical Decision Making Amount and/or Complexity of Data Reviewed Labs: ordered. Radiology: ordered.    Medical Screen Complete  This patient presented to the ED with complaint of right flank pain.  This complaint involves an extensive number of treatment options. The initial differential diagnosis includes, but is not limited to, metabolic abnormality, intra-abdominal pathology, renal colic, pyelonephritis, appendicitis  This presentation is: Acute, Self-Limited, Previously Undiagnosed, Uncertain  Prognosis, Complicated, Systemic Symptoms, and Threat to Life/Bodily Function  Patient with complaint of right-sided flank and groin pain.  Symptoms are significantly proved at time of evaluation.  Workup obtained is without clear evidence of significant pathology. Described symptoms are most consistent with likely passage of small renal stone.  Patient offered additional workup and/or observation.  She declined same.  She reports that she feels much improved.  She desires close discharge with plan for close follow-up with her PCP within the next several days.  Strict return precautions given and understood.  Additional history obtained: External records from outside sources obtained and reviewed including prior ED visits and prior Inpatient records.    Lab Tests:  I ordered and personally interpreted labs.  The pertinent results include: CBC, CMP, UA, lipase   Imaging Studies ordered:  I ordered imaging studies including CT abdomen pelvis I independently visualized and interpreted obtained imaging which showed NAD I agree with the radiologist interpretation.   Cardiac Monitoring:  The patient was maintained on a cardiac monitor.  I personally viewed and interpreted the cardiac monitor which showed an underlying rhythm of: NSR  Problem List / ED Course:  Right flank pain   Reevaluation:  After the interventions noted above, I reevaluated the patient and found that they have: improved  Disposition:  After consideration of the diagnostic results and the patients response to treatment, I feel that the patent would benefit from close outpatient follow-up.          Final Clinical Impression(s) / ED Diagnoses Final diagnoses:  Flank pain    Rx / DC Orders ED Discharge Orders     None         Wynetta Fines, MD 02/26/23 1555

## 2023-02-26 NOTE — ED Triage Notes (Signed)
Pt arrives to ED c/o right sided groin pain x 6 hours while sleeping. Pt initially dull and now intermittently sharp. No nausea or vomiting reported.

## 2023-02-28 DIAGNOSIS — M5136 Other intervertebral disc degeneration, lumbar region: Secondary | ICD-10-CM | POA: Diagnosis not present

## 2023-02-28 DIAGNOSIS — I700 Atherosclerosis of aorta: Secondary | ICD-10-CM | POA: Diagnosis not present

## 2023-02-28 DIAGNOSIS — R1030 Lower abdominal pain, unspecified: Secondary | ICD-10-CM | POA: Diagnosis not present

## 2023-04-13 DIAGNOSIS — Z8262 Family history of osteoporosis: Secondary | ICD-10-CM | POA: Diagnosis not present

## 2023-04-13 DIAGNOSIS — M8588 Other specified disorders of bone density and structure, other site: Secondary | ICD-10-CM | POA: Diagnosis not present

## 2023-04-13 DIAGNOSIS — E2839 Other primary ovarian failure: Secondary | ICD-10-CM | POA: Diagnosis not present

## 2024-06-01 ENCOUNTER — Encounter (INDEPENDENT_AMBULATORY_CARE_PROVIDER_SITE_OTHER): Payer: Self-pay

## 2024-07-04 ENCOUNTER — Encounter (INDEPENDENT_AMBULATORY_CARE_PROVIDER_SITE_OTHER): Payer: Self-pay | Admitting: Otolaryngology

## 2024-07-04 ENCOUNTER — Ambulatory Visit (INDEPENDENT_AMBULATORY_CARE_PROVIDER_SITE_OTHER): Admitting: Otolaryngology

## 2024-07-04 VITALS — BP 130/83 | HR 75 | Temp 97.8°F | Ht 65.0 in | Wt 163.0 lb

## 2024-07-04 DIAGNOSIS — J3089 Other allergic rhinitis: Secondary | ICD-10-CM

## 2024-07-04 NOTE — Progress Notes (Signed)
 Reason for Consult: Loss of smell Referring Physician: Dr. Altamease Gill Nichole Gill is an 69 y.o. female.  HPI: She is here for evaluation of nasal congestion issues that she is seeing allergist about recently.  Dr. Altamease gave her multiple treatment modalities and it is improved.  She has a slight decrease in her sense of smell but not loss of.  She had that previously before her sinus surgery which was in 2018.  Mainly she is here to be sure there is no polyp recurrence.  She does not have any headaches.  No purulent drainage.  Past Medical History:  Diagnosis Date   Fibroid 2007   3 cm   Lactose intolerance    Substance abuse (HCC) 2010   alcoholism    Past Surgical History:  Procedure Laterality Date   ADENOIDECTOMY     CESAREAN SECTION     CYST EXCISION Right 06/08/2019   Procedure: EXCISION CYST DEBRIDEMENT DISTAL INTERPHALANGEAL RIGHT MIDDLE FINGER;  Surgeon: Murrell Kuba, MD;  Location: [facility];  Service: Orthopedics;  Laterality: Right;  FAB ANESTHESIA   ENDOMETRIAL BIOPSY  05-25-06   -benign (Dr. Mandy)   IRRIGATION AND DEBRIDEMENT ABSCESS Left 04/27/2013   Procedure: Minor DEBRIDEMENT DISTAL INTERPHALANGEAL JOINT, EXCISION MUCOID CYSTS LEFT LONG FINGER AND LEFT RING FINGER (MINOR PROCEDURE) ;  Surgeon: Lamar LULLA Leonor Mickey., MD;  Location: [facility];  Service: Orthopedics;  Laterality: Left;  PENROSE DRAIN USED FOR TOURNIQUET--ON LEFT RING AT 1309, OFF AT 1323. ON LEFT LONG FINGER AT 1325, OFF AT 1335.    NASAL SINUS SURGERY  2010   TONSILLECTOMY      Family History  Problem Relation Age of Onset   Osteoporosis Mother    Stroke Mother    Heart Problems Mother    Allergic rhinitis Daughter    Asthma Daughter    Heart Problems Father    Angioedema Neg Hx    Atopy Neg Hx    Urticaria Neg Hx    Eczema Neg Hx     Social History:  reports that she has never smoked. She has never used smokeless tobacco. She reports that she does not  drink alcohol and does not use drugs.  Allergies: Allergies[1]   No results found for this or any previous visit (from the past 48 hours).  No results found.  ROS There were no vitals taken for this visit. Physical Exam Constitutional:      Appearance: Normal appearance.  HENT:     Head: Normocephalic and atraumatic.     Right Ear: Tympanic membrane is without lesions and middle ear aerated, ear canal and external ear normal.     Left Ear: Tympanic membrane is without lesions and middle ear aerated, ear canal and external ear normal.     Nose: Nose without deviation of septum.  Turbinates with mild hypertrophy, No significant swelling or masses.     Oral cavity/oropharynx: Mucous membranes are moist. No lesions or masses    Larynx: normal voice. Mirror attempted without success    Eyes:     Extraocular Movements: Extraocular movements intact.     Conjunctiva/sclera: Conjunctivae normal.     Pupils: Pupils are equal, round, and reactive to light.  Cardiovascular:     Rate and Rhythm: Normal rate.  Pulmonary:     Effort: Pulmonary effort is normal.  Musculoskeletal:     Cervical back: Normal range of motion and neck supple. No rigidity.  Lymphadenopathy:  Cervical: No cervical adenopathy or masses.salivary glands without lesions. .     Salivary glands- no mass or swelling Neurological:     Mental Status: He is alert. CN 2-12 intact. No nystagmus  Nasal endoscopy  She had the endoscopy 0 degree scope placed into the nose and the sinus was examined.  The left side had beautifully open sinus cavities with noninflamed normal-appearing mucosa.  No evidence of polyps.  The right side was harder to see into but also look to have clean open sinuses with no evidence of inflammation or polyps.  The scope was removed and she tolerated the procedure well    Assessment/Plan: Rhinitis-she is under the care of Dr. Altamease and doing well.  No polyps that are evident and her sinuses  actually look exceptionally good.  She will follow-up with me as needed.  Continue with Dr. Altamease.  [name] Notice 07/04/2024, 11:18 AM         [1]  Allergies Allergen Reactions   Dust Mite Extract Itching

## 2024-07-30 ENCOUNTER — Ambulatory Visit: Admitting: Neurology
